# Patient Record
Sex: Female | Born: 1970 | Race: White | Hispanic: No | Marital: Married | Smoking: Never smoker
Health system: Southern US, Community
[De-identification: ages and names within clinical notes are randomized; demographics above are authoritative.]

## PROBLEM LIST (undated history)

## (undated) DIAGNOSIS — J40 Bronchitis, not specified as acute or chronic: Secondary | ICD-10-CM

## (undated) DIAGNOSIS — R519 Headache, unspecified: Secondary | ICD-10-CM

## (undated) DIAGNOSIS — J181 Lobar pneumonia, unspecified organism: Secondary | ICD-10-CM

## (undated) HISTORY — DX: Lobar pneumonia, unspecified organism: J18.1

## (undated) HISTORY — PX: TUBAL LIGATION: SHX77

---

## 2007-06-25 ENCOUNTER — Ambulatory Visit: Payer: Self-pay

## 2007-06-26 ENCOUNTER — Inpatient Hospital Stay: Payer: Self-pay

## 2013-03-04 LAB — HM PAP SMEAR: HM Pap smear: NORMAL

## 2014-03-15 LAB — BASIC METABOLIC PANEL
BUN: 8 mg/dL (ref 4–21)
CREATININE: 0.8 mg/dL (ref 0.5–1.1)
Glucose: 98 mg/dL
POTASSIUM: 4.6 mmol/L (ref 3.4–5.3)
SODIUM: 140 mmol/L (ref 137–147)

## 2014-03-15 LAB — LIPID PANEL
CHOLESTEROL: 162 mg/dL (ref 0–200)
HDL: 64 mg/dL (ref 35–70)
LDL Cholesterol: 85 mg/dL
TRIGLYCERIDES: 65 mg/dL (ref 40–160)

## 2014-03-15 LAB — HEPATIC FUNCTION PANEL
ALT: 14 U/L (ref 7–35)
AST: 12 U/L — AB (ref 13–35)

## 2014-03-15 LAB — CBC AND DIFFERENTIAL
HCT: 35 % — AB (ref 36–46)
HEMOGLOBIN: 11.7 g/dL — AB (ref 12.0–16.0)
Platelets: 322 10*3/uL (ref 150–399)
WBC: 7.5 10*3/mL

## 2014-03-15 LAB — TSH: TSH: 0.99 u[IU]/mL (ref 0.41–5.90)

## 2014-03-24 ENCOUNTER — Ambulatory Visit: Payer: Self-pay | Admitting: Family Medicine

## 2014-06-15 ENCOUNTER — Ambulatory Visit: Payer: Self-pay | Admitting: Family Medicine

## 2014-06-28 ENCOUNTER — Ambulatory Visit: Payer: Self-pay | Admitting: Family Medicine

## 2015-05-04 ENCOUNTER — Encounter: Payer: Self-pay | Admitting: *Deleted

## 2015-05-04 ENCOUNTER — Ambulatory Visit (INDEPENDENT_AMBULATORY_CARE_PROVIDER_SITE_OTHER): Payer: 59 | Admitting: Family Medicine

## 2015-05-04 ENCOUNTER — Encounter: Payer: Self-pay | Admitting: Family Medicine

## 2015-05-04 VITALS — BP 110/80 | HR 70 | Temp 99.4°F | Resp 16 | Wt 189.0 lb

## 2015-05-04 DIAGNOSIS — R319 Hematuria, unspecified: Secondary | ICD-10-CM | POA: Insufficient documentation

## 2015-05-04 DIAGNOSIS — R059 Cough, unspecified: Secondary | ICD-10-CM

## 2015-05-04 DIAGNOSIS — R05 Cough: Secondary | ICD-10-CM | POA: Diagnosis not present

## 2015-05-04 DIAGNOSIS — K589 Irritable bowel syndrome without diarrhea: Secondary | ICD-10-CM | POA: Insufficient documentation

## 2015-05-04 DIAGNOSIS — S46819A Strain of other muscles, fascia and tendons at shoulder and upper arm level, unspecified arm, initial encounter: Secondary | ICD-10-CM | POA: Insufficient documentation

## 2015-05-04 DIAGNOSIS — N920 Excessive and frequent menstruation with regular cycle: Secondary | ICD-10-CM | POA: Insufficient documentation

## 2015-05-04 DIAGNOSIS — E559 Vitamin D deficiency, unspecified: Secondary | ICD-10-CM | POA: Insufficient documentation

## 2015-05-04 DIAGNOSIS — J181 Lobar pneumonia, unspecified organism: Secondary | ICD-10-CM | POA: Insufficient documentation

## 2015-05-04 DIAGNOSIS — B373 Candidiasis of vulva and vagina: Secondary | ICD-10-CM | POA: Insufficient documentation

## 2015-05-04 DIAGNOSIS — B3731 Acute candidiasis of vulva and vagina: Secondary | ICD-10-CM | POA: Insufficient documentation

## 2015-05-04 HISTORY — DX: Lobar pneumonia, unspecified organism: J18.1

## 2015-05-04 MED ORDER — AMOXICILLIN-POT CLAVULANATE 875-125 MG PO TABS: 1.0000 | ORAL_TABLET | Freq: Two times a day (BID) | ORAL | Status: AC

## 2015-05-04 MED ORDER — HYDROCODONE-HOMATROPINE 5-1.5 MG/5ML PO SYRP: 5.0000 mL | ORAL_SOLUTION | Freq: Three times a day (TID) | ORAL | Status: DC | PRN

## 2015-05-04 MED ORDER — BENZONATATE 100 MG PO CAPS: 100.0000 mg | ORAL_CAPSULE | Freq: Three times a day (TID) | ORAL | Status: DC

## 2015-05-04 NOTE — Progress Notes (Signed)
       Patient: Denise Stephens Female    DOB: [birth year]   44 y.o.   MRN: [id number] Visit Date: 05/04/2015  Today's Provider: Mila Merryonald Michael Ventresca, MD   Chief Complaint  Patient presents with  . Sinusitis  . Cough   Subjective:    HPI Started for URI symptoms about 2 weeks ago. Symptoms were improving until cough started getting much worse and started running fevers 5 days ago. Has been taking OTC Tessalon perls which help a little bit. Cough in productive clear sputum. She states current symptoms feel much like episode of pneumonia she had last winter which took over a month and three courses of antibiotics.     Allergies  Allergen Reactions  . Sulfa Antibiotics    Previous Medications   ALBUTEROL (PROVENTIL HFA) 108 (90 BASE) MCG/ACT INHALER    Inhale 2 puffs into the lungs every 6 (six) hours as needed.   DOXYCYCLINE (VIBRA-TABS) 100 MG TABLET    Take 1 tablet by mouth 2 (two) times daily.   LEVONORGESTREL-ETHINYL ESTRADIOL (AVIANE) 0.1-20 MG-MCG TABLET    Take 1 tablet by mouth daily.   TERCONAZOLE (TERAZOL 7) 0.4 % VAGINAL CREAM    Place 1 application vaginally at bedtime.    Review of Systems  Social History  Substance Use Topics  . Smoking status: Never Smoker   . Smokeless tobacco: Not on file  . Alcohol Use: No   Objective:   BP 110/80 mmHg  Pulse 70  Temp(Src) 99.4 F (37.4 C) (Oral)  Resp 16  Wt 189 lb (85.73 kg)  Physical Exam  General Appearance:    Alert, cooperative, no distress  HENT:   ENT exam normal, no neck nodes or sinus tenderness  Eyes:    PERRL, conjunctiva/corneas clear, EOM's intact       Lungs:     Clear to auscultation bilaterally, respirations unlabored  Heart:    Regular rate and rhythm  Neurologic:   Awake, alert, oriented x 3. No apparent focal neurological           defect.           Assessment & Plan:     1. Cough Similar to previous episode of pneumonia.  Call if symptoms change or if not rapidly improving.     -  HYDROcodone-homatropine (HYCODAN) 5-1.5 MG/5ML syrup; Take 5 mLs by mouth every 8 (eight) hours as needed for cough.  Dispense: 120 mL; Refill: 0 - benzonatate (TESSALON) 100 MG capsule; Take 1 capsule (100 mg total) by mouth every 8 (eight) hours.  Dispense: 20 capsule; Refill: 0 - amoxicillin-clavulanate (AUGMENTIN) 875-125 MG tablet; Take 1 tablet by mouth 2 (two) times daily.  Dispense: 20 tablet; Refill: 0        Mila Merryonald Jacquette Canales, MD  [facility]

## 2015-05-09 ENCOUNTER — Encounter: Payer: Self-pay | Admitting: Family Medicine

## 2015-06-30 ENCOUNTER — Other Ambulatory Visit: Payer: Self-pay

## 2016-06-26 ENCOUNTER — Encounter: Payer: Self-pay | Admitting: Family Medicine

## 2016-06-26 ENCOUNTER — Ambulatory Visit (INDEPENDENT_AMBULATORY_CARE_PROVIDER_SITE_OTHER): Payer: 59 | Admitting: Family Medicine

## 2016-06-26 VITALS — BP 110/84 | HR 96 | Temp 99.4°F | Resp 18 | Wt 168.4 lb

## 2016-06-26 DIAGNOSIS — J0100 Acute maxillary sinusitis, unspecified: Secondary | ICD-10-CM

## 2016-06-26 DIAGNOSIS — R509 Fever, unspecified: Secondary | ICD-10-CM | POA: Diagnosis not present

## 2016-06-26 DIAGNOSIS — R52 Pain, unspecified: Secondary | ICD-10-CM | POA: Diagnosis not present

## 2016-06-26 LAB — POCT INFLUENZA A/B
INFLUENZA A, POC: NEGATIVE
INFLUENZA B, POC: NEGATIVE

## 2016-06-26 MED ORDER — AMOXICILLIN 875 MG PO TABS: 875.0000 mg | ORAL_TABLET | Freq: Two times a day (BID) | ORAL | 0 refills | Status: DC

## 2016-06-26 NOTE — Patient Instructions (Signed)

## 2016-06-26 NOTE — Progress Notes (Signed)
Patient: Denise Stephens Female    DOB: [birth year]   46 y.o.   MRN: [id number] Visit Date: 06/26/2016  Today's Provider: Dortha Kern, PA   Chief Complaint  Patient presents with  . URI   Subjective:    URI   This is a new problem. Episode onset: Saturday. The problem has been gradually worsening. The maximum temperature recorded prior to her arrival was 100.4 - 100.9 F. The fever has been present for 1 to 2 days. Associated symptoms include congestion, diarrhea, headaches, rhinorrhea, sinus pain and a sore throat. Associated symptoms comments: Hoarseness, fever  . She has tried antihistamine (zyrtec D) for the symptoms. The treatment provided mild relief.   Past Medical History:  Diagnosis Date  . Lobar pneumonia (HCC) 05/04/2015   Patient Active Problem List   Diagnosis Date Noted  . Blood in the urine 05/04/2015  . IBS (irritable bowel syndrome) 05/04/2015  . Excess, menstruation 05/04/2015  . Strain of trapezius muscle 05/04/2015  . Candida vaginitis 05/04/2015  . Vitamin D deficiency 05/04/2015   Past Surgical History:  Procedure Laterality Date  . CESAREAN SECTION  200, 2002, 2009   x3  . TUBAL LIGATION     Family History  Problem Relation Age of Onset  . Heart disease Father   . Thyroid disease Sister   . Colon cancer Paternal Grandfather    Allergies  Allergen Reactions  . Sulfa Antibiotics      Previous Medications   No medications on file    Review of Systems  Constitutional: Negative.   HENT: Positive for congestion, rhinorrhea, sinus pain and sore throat.   Respiratory: Negative.   Cardiovascular: Negative.   Gastrointestinal: Positive for diarrhea.  Musculoskeletal: Positive for myalgias.  Neurological: Positive for headaches.    Social History  Substance Use Topics  . Smoking status: Never Smoker  . Smokeless tobacco: Not on file  . Alcohol use No   Objective:   BP 110/84 (BP Location: Right Arm, Patient Position: Sitting, Cuff  Size: Normal)   Pulse 96   Temp 99.4 F (37.4 C) (Oral)   Resp 18   Wt 168 lb 6.4 oz (76.4 kg)   SpO2 97%   BMI 31.82 kg/m   Physical Exam  Constitutional: She is oriented to person, place, and time. She appears well-developed and well-nourished. No distress.  HENT:  Head: Normocephalic and atraumatic.  Right Ear: Hearing and external ear normal.  Left Ear: Hearing and external ear normal.  Nose: Nose normal.  Cobblestoning of posterior pharynx with tenderness and poor transillumination of maxillary sinuses (L<R).  Eyes: Conjunctivae and lids are normal. Right eye exhibits no discharge. Left eye exhibits no discharge. No scleral icterus.  Neck: Neck supple.  Cardiovascular: Normal rate and regular rhythm.   Pulmonary/Chest: Effort normal and breath sounds normal. No respiratory distress.  Musculoskeletal: Normal range of motion.  Neurological: She is alert and oriented to person, place, and time.  Skin: Skin is intact. No lesion and no rash noted.  Psychiatric: She has a normal mood and affect. Her speech is normal and behavior is normal. Thought content normal.      Assessment & Plan:     1. Fever, unspecified fever cause Onset 2-3 days ago with headache, rhinorrhea and some hoarseness. Having sinus pressure and pain now with enlargement and soreness of the left posterior cervical nodes. Negative influenza test. May use Tylenol or Advil prn. Increase fluid intake and should be out of work from school (  works in [employer]) until free of fever for 24 hours.  - POCT Influenza A/B  2. Body aches Onset with fever and headache. Negative flu test. - POCT Influenza A/B  3. Acute maxillary sinusitis, recurrence not specified Tender maxillary sinuses with poor transillumination of the left maxillary sinus today. May use Mucinex with Zyrtec-D and add Amoxicillin. Increase fluid intake and use Tylenol or Advil for discomfort or fever. Recheck prn. - amoxicillin (AMOXIL) 875 MG tablet;  Take 1 tablet (875 mg total) by mouth 2 (two) times daily.  Dispense: 20 tablet; Refill: 0

## 2016-06-29 ENCOUNTER — Other Ambulatory Visit: Payer: Self-pay | Admitting: Family Medicine

## 2016-06-29 ENCOUNTER — Telehealth: Payer: Self-pay | Admitting: Family Medicine

## 2016-06-29 MED ORDER — FLUCONAZOLE 150 MG PO TABS: 150.0000 mg | ORAL_TABLET | Freq: Once | ORAL | 0 refills | Status: AC

## 2016-06-29 NOTE — Telephone Encounter (Signed)
[facility] [place] [facility] [place]

## 2016-06-29 NOTE — Telephone Encounter (Signed)
Pt has been on antibiotic since Tuesday for sinus infection. She is starting to get a yeast infection.  She is eating yogurt.  [place] [facility] [place] and would like diflucan pill scall in  Pt's call back is [phone]  Thanks, Barth Kirkseri

## 2016-06-29 NOTE — Telephone Encounter (Signed)
Patient advised.

## 2016-07-16 ENCOUNTER — Encounter: Payer: Self-pay | Admitting: Physician Assistant

## 2016-07-16 ENCOUNTER — Ambulatory Visit (INDEPENDENT_AMBULATORY_CARE_PROVIDER_SITE_OTHER): Payer: 59 | Admitting: Physician Assistant

## 2016-07-16 VITALS — BP 118/80 | HR 80 | Temp 98.9°F | Resp 16 | Wt 172.0 lb

## 2016-07-16 DIAGNOSIS — Z1231 Encounter for screening mammogram for malignant neoplasm of breast: Secondary | ICD-10-CM

## 2016-07-16 DIAGNOSIS — Z0000 Encounter for general adult medical examination without abnormal findings: Secondary | ICD-10-CM

## 2016-07-16 DIAGNOSIS — Z111 Encounter for screening for respiratory tuberculosis: Secondary | ICD-10-CM | POA: Diagnosis not present

## 2016-07-16 DIAGNOSIS — D649 Anemia, unspecified: Secondary | ICD-10-CM

## 2016-07-16 DIAGNOSIS — Z1239 Encounter for other screening for malignant neoplasm of breast: Secondary | ICD-10-CM

## 2016-07-16 NOTE — Progress Notes (Signed)
     Patient: Denise Stephens, Female    DOB: 09/12/1970, 45 y.o.   MRN: [id number] Visit Date: 07/16/2016  Today's Provider: Adriana M Pollak, PA-C   Chief Complaint  Patient presents with  . Annual Exam   Subjective:    Annual physical exam Denise Stephens is a 45 y.o. female who presents today for health maintenance and complete physical. She feels fairly well. Still having some sinus symptoms, was seen in clinic a week ago by Dennis.   She reports not exercising. She reports she is sleeping fairly well.  She is starting a new job at [employer] in [place], she needs a TB screening for this.  She does not smoke or drink.  She is married for 20 years and lives in  with husband and kids. Three sons, ages 17, 14, 9.   Last PAP/HPV in 2014, normal. Never had mammogram. Paternal grandmother with breast cancer, uncertain age. No hx colon cancer in family.   Otherwise healthy. -----------------------------------------------------------------   Review of Systems  Constitutional: Negative.   HENT: Positive for sore throat.   Eyes: Negative.   Respiratory: Positive for cough.   Gastrointestinal: Negative.   Endocrine: Negative.   Genitourinary: Negative.   Musculoskeletal: Negative.   Skin: Negative.   Allergic/Immunologic: Negative.   Neurological: Negative.   Hematological: Negative.   Psychiatric/Behavioral: Negative.     Social History      She  reports that she has never smoked. She has never used smokeless tobacco. She reports that she does not drink alcohol or use drugs.       Social History   Social History  . Marital status: Married    Spouse name: N/A  . Number of children: 3  . Years of education: N/A   Social History Main Topics  . Smoking status: Never Smoker  . Smokeless tobacco: Never Used  . Alcohol use No  . Drug use: No  . Sexual activity: Yes   Other Topics Concern  . None   Social History Narrative  .  None    Past Medical History:  Diagnosis Date  . Lobar pneumonia (HCC) 05/04/2015     Patient Active Problem List   Diagnosis Date Noted  . Blood in the urine 05/04/2015  . IBS (irritable bowel syndrome) 05/04/2015  . Excess, menstruation 05/04/2015  . Strain of trapezius muscle 05/04/2015  . Candida vaginitis 05/04/2015  . Vitamin D deficiency 05/04/2015    Past Surgical History:  Procedure Laterality Date  . CESAREAN SECTION  200, 2002, 2009   x3  . TUBAL LIGATION      Family History        Family Status  Relation Status  . Mother Alive  . Father Alive  . Sister Alive  . Paternal Grandfather Deceased   colon cancer        Her family history includes Colon cancer in her paternal grandfather; Heart disease in her father; Thyroid disease in her sister.     Allergies  Allergen Reactions  . Sulfa Antibiotics     No current outpatient prescriptions on file.   Patient Care Team: Donald E Fisher, MD as PCP - General (Family Medicine)      Objective:   Vitals: BP 118/80 (BP Location: Left Arm, Patient Position: Sitting, Cuff Size: Normal)   Pulse 80   Temp 98.9 F (37.2 C) (Oral)   Resp 16   Wt 172 lb (78 kg)   LMP 07/05/2016     BMI 32.50 kg/m    Physical Exam  Constitutional: She is oriented to person, place, and time. She appears well-developed and well-nourished.  HENT:  Right Ear: External ear normal.  Left Ear: External ear normal.  Mouth/Throat: Oropharynx is clear and moist. No oropharyngeal exudate.  Eyes: Conjunctivae are normal.  Neck: Neck supple. No thyromegaly present.  Cardiovascular: Normal rate, regular rhythm and normal heart sounds.  Exam reveals no gallop and no friction rub.   No murmur heard. Pulmonary/Chest: Effort normal and breath sounds normal. Right breast exhibits no inverted nipple, no mass, no nipple discharge, no skin change and no tenderness. Left breast exhibits no inverted nipple, no mass, no nipple discharge, no skin  change and no tenderness. Breasts are symmetrical.  Chaperoned  Abdominal: Soft. Bowel sounds are normal.  Lymphadenopathy:    She has no cervical adenopathy.  Neurological: She is alert and oriented to person, place, and time.  Skin: Skin is warm and dry.  Psychiatric: She has a normal mood and affect. Her behavior is normal.     Depression Screen PHQ 2/9 Scores 07/16/2016  PHQ - 2 Score 0      Assessment & Plan:     Routine Health Maintenance and Physical Exam  Exercise Activities and Dietary recommendations Goals    None      Immunization History  Administered Date(s) Administered  . Influenza,inj,Quad PF,36+ Mos 03/04/2013  . Tdap 03/04/2013    Health Maintenance  Topic Date Due  . HIV Screening  06/17/1988  . PAP SMEAR  06/17/1994  . INFLUENZA VACCINE  01/17/2016  . TETANUS/TDAP  03/05/2023     Discussed health benefits of physical activity, and encouraged her to engage in regular exercise appropriate for her age and condition.    1. Annual physical exam  Return next year for PAP/HPV exam.  - Comprehensive metabolic panel - TSH - Lipid panel  2. Screening-pulmonary TB  - Quantiferon tb gold assay  3. Low hemoglobin  - CBC with Differential/Platelet  4. Breast cancer screening  Gave info on [facility]. - MM DIGITAL SCREENING BILATERAL; Future  Return in about 1 year (around 07/16/2017) for CPE with pap.   Patient Instructions  Health Maintenance, Female Introduction Adopting a healthy lifestyle and getting preventive care can go a long way to promote health and wellness. Talk with your health care provider about what schedule of regular examinations is right for you. This is a good chance for you to check in with your provider about disease prevention and staying healthy. In between checkups, there are plenty of things you can do on your own. Experts have done a lot of research about which lifestyle changes and preventive measures  are most likely to keep you healthy. Ask your health care provider for more information. Weight and diet Eat a healthy diet  Be sure to include plenty of vegetables, fruits, low-fat dairy products, and lean protein.  Do not eat a lot of foods high in solid fats, added sugars, or salt.  Get regular exercise. This is one of the most important things you can do for your health.  Most adults should exercise for at least 150 minutes each week. The exercise should increase your heart rate and make you sweat (moderate-intensity exercise).  Most adults should also do strengthening exercises at least twice a week. This is in addition to the moderate-intensity exercise. Maintain a healthy weight  Body mass index (BMI) is a measurement that can be used to identify   possible weight problems. It estimates body fat based on height and weight. Your health care provider can help determine your BMI and help you achieve or maintain a healthy weight.  For females 65 years of age and older:  A BMI below 18.5 is considered underweight.  A BMI of 18.5 to 24.9 is normal.  A BMI of 25 to 29.9 is considered overweight.  A BMI of 30 and above is considered obese. Watch levels of cholesterol and blood lipids  You should start having your blood tested for lipids and cholesterol at 44 years of age, then have this test every 5 years.  You may need to have your cholesterol levels checked more often if:  Your lipid or cholesterol levels are high.  You are older than 44 years of age.  You are at high risk for heart disease. Cancer screening Lung Cancer  Lung cancer screening is recommended for adults 26-71 years old who are at high risk for lung cancer because of a history of smoking.  A yearly low-dose CT scan of the lungs is recommended for people who:  Currently smoke.  Have quit within the past 15 years.  Have at least a 30-pack-year history of smoking. A pack year is smoking an average of one pack  of cigarettes a day for 1 year.  Yearly screening should continue until it has been 15 years since you quit.  Yearly screening should stop if you develop a health problem that would prevent you from having lung cancer treatment. Breast Cancer  Practice breast self-awareness. This means understanding how your breasts normally appear and feel.  It also means doing regular breast self-exams. Let your health care provider know about any changes, no matter how small.  If you are in your 20s or 30s, you should have a clinical breast exam (CBE) by a health care provider every 1-3 years as part of a regular health exam.  If you are 72 or older, have a CBE every year. Also consider having a breast X-ray (mammogram) every year.  If you have a family history of breast cancer, talk to your health care provider about genetic screening.  If you are at high risk for breast cancer, talk to your health care provider about having an MRI and a mammogram every year.  Breast cancer gene (BRCA) assessment is recommended for women who have family members with BRCA-related cancers. BRCA-related cancers include:  Breast.  Ovarian.  Tubal.  Peritoneal cancers.  Results of the assessment will determine the need for genetic counseling and BRCA1 and BRCA2 testing. Cervical Cancer  Your health care provider may recommend that you be screened regularly for cancer of the pelvic organs (ovaries, uterus, and vagina). This screening involves a pelvic examination, including checking for microscopic changes to the surface of your cervix (Pap test). You may be encouraged to have this screening done every 3 years, beginning at age 49.  For women ages 59-65, health care providers may recommend pelvic exams and Pap testing every 3 years, or they may recommend the Pap and pelvic exam, combined with testing for human papilloma virus (HPV), every 5 years. Some types of HPV increase your risk of cervical cancer. Testing for HPV  may also be done on women of any age with unclear Pap test results.  Other health care providers may not recommend any screening for nonpregnant women who are considered low risk for pelvic cancer and who do not have symptoms. Ask your health care provider if a  screening pelvic exam is right for you.  If you have had past treatment for cervical cancer or a condition that could lead to cancer, you need Pap tests and screening for cancer for at least 20 years after your treatment. If Pap tests have been discontinued, your risk factors (such as having a new sexual partner) need to be reassessed to determine if screening should resume. Some women have medical problems that increase the chance of getting cervical cancer. In these cases, your health care provider may recommend more frequent screening and Pap tests. Colorectal Cancer  This type of cancer can be detected and often prevented.  Routine colorectal cancer screening usually begins at 44 years of age and continues through 44 years of age.  Your health care provider may recommend screening at an earlier age if you have risk factors for colon cancer.  Your health care provider may also recommend using home test kits to check for hidden blood in the stool.  A small camera at the end of a tube can be used to examine your colon directly (sigmoidoscopy or colonoscopy). This is done to check for the earliest forms of colorectal cancer.  Routine screening usually begins at age 46.  Direct examination of the colon should be repeated every 5-10 years through 44 years of age. However, you may need to be screened more often if early forms of precancerous polyps or small growths are found. Skin Cancer  Check your skin from head to toe regularly.  Tell your health care provider about any new moles or changes in moles, especially if there is a change in a mole's shape or color.  Also tell your health care provider if you have a mole that is larger than  the size of a pencil eraser.  Always use sunscreen. Apply sunscreen liberally and repeatedly throughout the day.  Protect yourself by wearing long sleeves, pants, a wide-brimmed hat, and sunglasses whenever you are outside. Heart disease, diabetes, and high blood pressure  High blood pressure causes heart disease and increases the risk of stroke. High blood pressure is more likely to develop in:  People who have blood pressure in the high end of the normal range (130-139/85-89 mm Hg).  People who are overweight or obese.  People who are African American.  If you are 52-63 years of age, have your blood pressure checked every 3-5 years. If you are 86 years of age or older, have your blood pressure checked every year. You should have your blood pressure measured twice-once when you are at a hospital or clinic, and once when you are not at a hospital or clinic. Record the average of the two measurements. To check your blood pressure when you are not at a hospital or clinic, you can use:  An automated blood pressure machine at a pharmacy.  A home blood pressure monitor.  If you are between 26 years and 85 years old, ask your health care provider if you should take aspirin to prevent strokes.  Have regular diabetes screenings. This involves taking a blood sample to check your fasting blood sugar level.  If you are at a normal weight and have a low risk for diabetes, have this test once every three years after 44 years of age.  If you are overweight and have a high risk for diabetes, consider being tested at a younger age or more often. Preventing infection Hepatitis B  If you have a higher risk for hepatitis B, you should be screened  for this virus. You are considered at high risk for hepatitis B if:  You were born in a country where hepatitis B is common. Ask your health care provider which countries are considered high risk.  Your parents were born in a high-risk country, and you have  not been immunized against hepatitis B (hepatitis B vaccine).  You have HIV or AIDS.  You use needles to inject street drugs.  You live with someone who has hepatitis B.  You have had sex with someone who has hepatitis B.  You get hemodialysis treatment.  You take certain medicines for conditions, including cancer, organ transplantation, and autoimmune conditions. Hepatitis C  Blood testing is recommended for:  Everyone born from [birth year] through 1963.  Anyone with known risk factors for hepatitis C. Sexually transmitted infections (STIs)  You should be screened for sexually transmitted infections (STIs) including gonorrhea and chlamydia if:  You are sexually active and are younger than 44 years of age.  You are older than 44 years of age and your health care provider tells you that you are at risk for this type of infection.  Your sexual activity has changed since you were last screened and you are at an increased risk for chlamydia or gonorrhea. Ask your health care provider if you are at risk.  If you do not have HIV, but are at risk, it may be recommended that you take a prescription medicine daily to prevent HIV infection. This is called pre-exposure prophylaxis (PrEP). You are considered at risk if:  You are sexually active and do not regularly use condoms or know the HIV status of your partner(s).  You take drugs by injection.  You are sexually active with a partner who has HIV. Talk with your health care provider about whether you are at high risk of being infected with HIV. If you choose to begin PrEP, you should first be tested for HIV. You should then be tested every 3 months for as long as you are taking PrEP. Pregnancy  If you are premenopausal and you may become pregnant, ask your health care provider about preconception counseling.  If you may become pregnant, take 400 to 800 micrograms (mcg) of folic acid every day.  If you want to prevent pregnancy, talk to  your health care provider about birth control (contraception). Osteoporosis and menopause  Osteoporosis is a disease in which the bones lose minerals and strength with aging. This can result in serious bone fractures. Your risk for osteoporosis can be identified using a bone density scan.  If you are 58 years of age or older, or if you are at risk for osteoporosis and fractures, ask your health care provider if you should be screened.  Ask your health care provider whether you should take a calcium or vitamin D supplement to lower your risk for osteoporosis.  Menopause may have certain physical symptoms and risks.  Hormone replacement therapy may reduce some of these symptoms and risks. Talk to your health care provider about whether hormone replacement therapy is right for you. Follow these instructions at home:  Schedule regular health, dental, and eye exams.  Stay current with your immunizations.  Do not use any tobacco products including cigarettes, chewing tobacco, or electronic cigarettes.  If you are pregnant, do not drink alcohol.  If you are breastfeeding, limit how much and how often you drink alcohol.  Limit alcohol intake to no more than 1 drink per day for nonpregnant women. One drink equals 12  ounces of beer, 5 ounces of wine, or 1 ounces of hard liquor.  Do not use street drugs.  Do not share needles.  Ask your health care provider for help if you need support or information about quitting drugs.  Tell your health care provider if you often feel depressed.  Tell your health care provider if you have ever been abused or do not feel safe at home. This information is not intended to replace advice given to you by your health care provider. Make sure you discuss any questions you have with your health care provider. Document Released: 12/18/2010 Document Revised: 11/10/2015 Document Reviewed: 03/08/2015  2017 [employer] Health Maintenance,  Female Introduction Adopting a healthy lifestyle and getting preventive care can go a long way to promote health and wellness. Talk with your health care provider about what schedule of regular examinations is right for you. This is a good chance for you to check in with your provider about disease prevention and staying healthy. In between checkups, there are plenty of things you can do on your own. Experts have done a lot of research about which lifestyle changes and preventive measures are most likely to keep you healthy. Ask your health care provider for more information. Weight and diet Eat a healthy diet  Be sure to include plenty of vegetables, fruits, low-fat dairy products, and lean protein.  Do not eat a lot of foods high in solid fats, added sugars, or salt.  Get regular exercise. This is one of the most important things you can do for your health.  Most adults should exercise for at least 150 minutes each week. The exercise should increase your heart rate and make you sweat (moderate-intensity exercise).  Most adults should also do strengthening exercises at least twice a week. This is in addition to the moderate-intensity exercise. Maintain a healthy weight  Body mass index (BMI) is a measurement that can be used to identify possible weight problems. It estimates body fat based on height and weight. Your health care provider can help determine your BMI and help you achieve or maintain a healthy weight.  For females 21 years of age and older:  A BMI below 18.5 is considered underweight.  A BMI of 18.5 to 24.9 is normal.  A BMI of 25 to 29.9 is considered overweight.  A BMI of 30 and above is considered obese. Watch levels of cholesterol and blood lipids  You should start having your blood tested for lipids and cholesterol at 44 years of age, then have this test every 5 years.  You may need to have your cholesterol levels checked more often if:  Your lipid or cholesterol  levels are high.  You are older than 44 years of age.  You are at high risk for heart disease. Cancer screening Lung Cancer  Lung cancer screening is recommended for adults 11-15 years old who are at high risk for lung cancer because of a history of smoking.  A yearly low-dose CT scan of the lungs is recommended for people who:  Currently smoke.  Have quit within the past 15 years.  Have at least a 30-pack-year history of smoking. A pack year is smoking an average of one pack of cigarettes a day for 1 year.  Yearly screening should continue until it has been 15 years since you quit.  Yearly screening should stop if you develop a health problem that would prevent you from having lung cancer treatment. Breast Cancer  Practice breast self-awareness.  This means understanding how your breasts normally appear and feel.  It also means doing regular breast self-exams. Let your health care provider know about any changes, no matter how small.  If you are in your 20s or 30s, you should have a clinical breast exam (CBE) by a health care provider every 1-3 years as part of a regular health exam.  If you are 38 or older, have a CBE every year. Also consider having a breast X-ray (mammogram) every year.  If you have a family history of breast cancer, talk to your health care provider about genetic screening.  If you are at high risk for breast cancer, talk to your health care provider about having an MRI and a mammogram every year.  Breast cancer gene (BRCA) assessment is recommended for women who have family members with BRCA-related cancers. BRCA-related cancers include:  Breast.  Ovarian.  Tubal.  Peritoneal cancers.  Results of the assessment will determine the need for genetic counseling and BRCA1 and BRCA2 testing. Cervical Cancer  Your health care provider may recommend that you be screened regularly for cancer of the pelvic organs (ovaries, uterus, and vagina). This screening  involves a pelvic examination, including checking for microscopic changes to the surface of your cervix (Pap test). You may be encouraged to have this screening done every 3 years, beginning at age 82.  For women ages 38-65, health care providers may recommend pelvic exams and Pap testing every 3 years, or they may recommend the Pap and pelvic exam, combined with testing for human papilloma virus (HPV), every 5 years. Some types of HPV increase your risk of cervical cancer. Testing for HPV may also be done on women of any age with unclear Pap test results.  Other health care providers may not recommend any screening for nonpregnant women who are considered low risk for pelvic cancer and who do not have symptoms. Ask your health care provider if a screening pelvic exam is right for you.  If you have had past treatment for cervical cancer or a condition that could lead to cancer, you need Pap tests and screening for cancer for at least 20 years after your treatment. If Pap tests have been discontinued, your risk factors (such as having a new sexual partner) need to be reassessed to determine if screening should resume. Some women have medical problems that increase the chance of getting cervical cancer. In these cases, your health care provider may recommend more frequent screening and Pap tests. Colorectal Cancer  This type of cancer can be detected and often prevented.  Routine colorectal cancer screening usually begins at 44 years of age and continues through 44 years of age.  Your health care provider may recommend screening at an earlier age if you have risk factors for colon cancer.  Your health care provider may also recommend using home test kits to check for hidden blood in the stool.  A small camera at the end of a tube can be used to examine your colon directly (sigmoidoscopy or colonoscopy). This is done to check for the earliest forms of colorectal cancer.  Routine screening usually  begins at age 37.  Direct examination of the colon should be repeated every 5-10 years through 44 years of age. However, you may need to be screened more often if early forms of precancerous polyps or small growths are found. Skin Cancer  Check your skin from head to toe regularly.  Tell your health care provider about any new moles  or changes in moles, especially if there is a change in a mole's shape or color.  Also tell your health care provider if you have a mole that is larger than the size of a pencil eraser.  Always use sunscreen. Apply sunscreen liberally and repeatedly throughout the day.  Protect yourself by wearing long sleeves, pants, a wide-brimmed hat, and sunglasses whenever you are outside. Heart disease, diabetes, and high blood pressure  High blood pressure causes heart disease and increases the risk of stroke. High blood pressure is more likely to develop in:  People who have blood pressure in the high end of the normal range (130-139/85-89 mm Hg).  People who are overweight or obese.  People who are African American.  If you are 18-39 years of age, have your blood pressure checked every 3-5 years. If you are 40 years of age or older, have your blood pressure checked every year. You should have your blood pressure measured twice-once when you are at a hospital or clinic, and once when you are not at a hospital or clinic. Record the average of the two measurements. To check your blood pressure when you are not at a hospital or clinic, you can use:  An automated blood pressure machine at a pharmacy.  A home blood pressure monitor.  If you are between 55 years and 79 years old, ask your health care provider if you should take aspirin to prevent strokes.  Have regular diabetes screenings. This involves taking a blood sample to check your fasting blood sugar level.  If you are at a normal weight and have a low risk for diabetes, have this test once every three years  after 45 years of age.  If you are overweight and have a high risk for diabetes, consider being tested at a younger age or more often. Preventing infection Hepatitis B  If you have a higher risk for hepatitis B, you should be screened for this virus. You are considered at high risk for hepatitis B if:  You were born in a country where hepatitis B is common. Ask your health care provider which countries are considered high risk.  Your parents were born in a high-risk country, and you have not been immunized against hepatitis B (hepatitis B vaccine).  You have HIV or AIDS.  You use needles to inject street drugs.  You live with someone who has hepatitis B.  You have had sex with someone who has hepatitis B.  You get hemodialysis treatment.  You take certain medicines for conditions, including cancer, organ transplantation, and autoimmune conditions. Hepatitis C  Blood testing is recommended for:  Everyone born from 1945 through 1965.  Anyone with known risk factors for hepatitis C. Sexually transmitted infections (STIs)  You should be screened for sexually transmitted infections (STIs) including gonorrhea and chlamydia if:  You are sexually active and are younger than 44 years of age.  You are older than 44 years of age and your health care provider tells you that you are at risk for this type of infection.  Your sexual activity has changed since you were last screened and you are at an increased risk for chlamydia or gonorrhea. Ask your health care provider if you are at risk.  If you do not have HIV, but are at risk, it may be recommended that you take a prescription medicine daily to prevent HIV infection. This is called pre-exposure prophylaxis (PrEP). You are considered at risk if:  You are sexually   active and do not regularly use condoms or know the HIV status of your partner(s).  You take drugs by injection.  You are sexually active with a partner who has HIV. Talk  with your health care provider about whether you are at high risk of being infected with HIV. If you choose to begin PrEP, you should first be tested for HIV. You should then be tested every 3 months for as long as you are taking PrEP. Pregnancy  If you are premenopausal and you may become pregnant, ask your health care provider about preconception counseling.  If you may become pregnant, take 400 to 800 micrograms (mcg) of folic acid every day.  If you want to prevent pregnancy, talk to your health care provider about birth control (contraception). Osteoporosis and menopause  Osteoporosis is a disease in which the bones lose minerals and strength with aging. This can result in serious bone fractures. Your risk for osteoporosis can be identified using a bone density scan.  If you are 5 years of age or older, or if you are at risk for osteoporosis and fractures, ask your health care provider if you should be screened.  Ask your health care provider whether you should take a calcium or vitamin D supplement to lower your risk for osteoporosis.  Menopause may have certain physical symptoms and risks.  Hormone replacement therapy may reduce some of these symptoms and risks. Talk to your health care provider about whether hormone replacement therapy is right for you. Follow these instructions at home:  Schedule regular health, dental, and eye exams.  Stay current with your immunizations.  Do not use any tobacco products including cigarettes, chewing tobacco, or electronic cigarettes.  If you are pregnant, do not drink alcohol.  If you are breastfeeding, limit how much and how often you drink alcohol.  Limit alcohol intake to no more than 1 drink per day for nonpregnant women. One drink equals 12 ounces of beer, 5 ounces of wine, or 1 ounces of hard liquor.  Do not use street drugs.  Do not share needles.  Ask your health care provider for help if you need support or information  about quitting drugs.  Tell your health care provider if you often feel depressed.  Tell your health care provider if you have ever been abused or do not feel safe at home. This information is not intended to replace advice given to you by your health care provider. Make sure you discuss any questions you have with your health care provider. Document Released: 12/18/2010 Document Revised: 11/10/2015 Document Reviewed: 03/08/2015  2017 [employer]   The entirety of the information documented in the History of Present Illness, Review of Systems and Physical Exam were personally obtained by me. Portions of this information were initially documented by Ashley Royalty, CMA and reviewed by me for thoroughness and accuracy.      --------------------------------------------------------------------    Trinna Post, PA-C  [facility]

## 2016-07-16 NOTE — Patient Instructions (Addendum)
Health Maintenance, Female Introduction Adopting a healthy lifestyle and getting preventive care can go a long way to promote health and wellness. Talk with your health care provider about what schedule of regular examinations is right for you. This is a good chance for you to check in with your provider about disease prevention and staying healthy. In between checkups, there are plenty of things you can do on your own. Experts have done a lot of research about which lifestyle changes and preventive measures are most likely to keep you healthy. Ask your health care provider for more information. Weight and diet Eat a healthy diet  Be sure to include plenty of vegetables, fruits, low-fat dairy products, and lean protein.  Do not eat a lot of foods high in solid fats, added sugars, or salt.  Get regular exercise. This is one of the most important things you can do for your health.  Most adults should exercise for at least 150 minutes each week. The exercise should increase your heart rate and make you sweat (moderate-intensity exercise).  Most adults should also do strengthening exercises at least twice a week. This is in addition to the moderate-intensity exercise. Maintain a healthy weight  Body mass index (BMI) is a measurement that can be used to identify possible weight problems. It estimates body fat based on height and weight.  Your health care provider can help determine your BMI and help you achieve or maintain a healthy weight.  For females 44 years of age and older:  A BMI below 18.5 is considered underweight.  A BMI of 18.5 to 24.9 is normal.  A BMI of 25 to 29.9 is considered overweight.  A BMI of 30 and above is considered obese. Watch levels of cholesterol and blood lipids  You should start having your blood tested for lipids and cholesterol at 44 years of age, then have this test every 5 years.  You may need to have your cholesterol levels checked more often if:  Your  lipid or cholesterol levels are high.  You are older than 44 years of age.  You are at high risk for heart disease. Cancer screening Lung Cancer  Lung cancer screening is recommended for adults 44-22 years old who are at high risk for lung cancer because of a history of smoking.  A yearly low-dose CT scan of the lungs is recommended for people who:  Currently smoke.  Have quit within the past 44 years.  Have at least a 30-pack-year history of smoking. A pack year is smoking an average of one pack of cigarettes a day for 1 year.  Yearly screening should continue until it has been 15 years since you quit.  Yearly screening should stop if you develop a health problem that would prevent you from having lung cancer treatment. Breast Cancer  Practice breast self-awareness. This means understanding how your breasts normally appear and feel.  It also means doing regular breast self-exams. Let your health care provider know about any changes, no matter how small.  If you are in your 20s or 30s, you should have a clinical breast exam (CBE) by a health care provider every 1-3 years as part of a regular health exam.  If you are 35 or older, have a CBE every year. Also consider having a breast X-ray (mammogram) every year.  If you have a family history of breast cancer, talk to your health care provider about genetic screening.  If you are at high risk for breast cancer,  talk to your health care provider about having an MRI and a mammogram every year.  Breast cancer gene (BRCA) assessment is recommended for women who have family members with BRCA-related cancers. BRCA-related cancers include:  Breast.  Ovarian.  Tubal.  Peritoneal cancers.  Results of the assessment will determine the need for genetic counseling and BRCA1 and BRCA2 testing. Cervical Cancer  Your health care provider may recommend that you be screened regularly for cancer of the pelvic organs (ovaries, uterus, and  vagina). This screening involves a pelvic examination, including checking for microscopic changes to the surface of your cervix (Pap test). You may be encouraged to have this screening done every 3 years, beginning at age 21.  For women ages 30-65, health care providers may recommend pelvic exams and Pap testing every 3 years, or they may recommend the Pap and pelvic exam, combined with testing for human papilloma virus (HPV), every 5 years. Some types of HPV increase your risk of cervical cancer. Testing for HPV may also be done on women of any age with unclear Pap test results.  Other health care providers may not recommend any screening for nonpregnant women who are considered low risk for pelvic cancer and who do not have symptoms. Ask your health care provider if a screening pelvic exam is right for you.  If you have had past treatment for cervical cancer or a condition that could lead to cancer, you need Pap tests and screening for cancer for at least 20 years after your treatment. If Pap tests have been discontinued, your risk factors (such as having a new sexual partner) need to be reassessed to determine if screening should resume. Some women have medical problems that increase the chance of getting cervical cancer. In these cases, your health care provider may recommend more frequent screening and Pap tests. Colorectal Cancer  This type of cancer can be detected and often prevented.  Routine colorectal cancer screening usually begins at 44 years of age and continues through 44 years of age.  Your health care provider may recommend screening at an earlier age if you have risk factors for colon cancer.  Your health care provider may also recommend using home test kits to check for hidden blood in the stool.  A small camera at the end of a tube can be used to examine your colon directly (sigmoidoscopy or colonoscopy). This is done to check for the earliest forms of colorectal  cancer.  Routine screening usually begins at age 50.  Direct examination of the colon should be repeated every 5-10 years through 44 years of age. However, you may need to be screened more often if early forms of precancerous polyps or small growths are found. Skin Cancer  Check your skin from head to toe regularly.  Tell your health care provider about any new moles or changes in moles, especially if there is a change in a mole's shape or color.  Also tell your health care provider if you have a mole that is larger than the size of a pencil eraser.  Always use sunscreen. Apply sunscreen liberally and repeatedly throughout the day.  Protect yourself by wearing long sleeves, pants, a wide-brimmed hat, and sunglasses whenever you are outside. Heart disease, diabetes, and high blood pressure  High blood pressure causes heart disease and increases the risk of stroke. High blood pressure is more likely to develop in:  People who have blood pressure in the high end of the normal range (130-139/85-89 mm Hg).    People who are overweight or obese.  People who are African American.  If you are 55-88 years of age, have your blood pressure checked every 3-5 years. If you are 10 years of age or older, have your blood pressure checked every year. You should have your blood pressure measured twice--once when you are at a hospital or clinic, and once when you are not at a hospital or clinic. Record the average of the two measurements. To check your blood pressure when you are not at a hospital or clinic, you can use:  An automated blood pressure machine at a pharmacy.  A home blood pressure monitor.  If you are between 29 years and 58 years old, ask your health care provider if you should take aspirin to prevent strokes.  Have regular diabetes screenings. This involves taking a blood sample to check your fasting blood sugar level.  If you are at a normal weight and have a low risk for diabetes,  have this test once every three years after 44 years of age.  If you are overweight and have a high risk for diabetes, consider being tested at a younger age or more often. Preventing infection Hepatitis B  If you have a higher risk for hepatitis B, you should be screened for this virus. You are considered at high risk for hepatitis B if:  You were born in a country where hepatitis B is common. Ask your health care provider which countries are considered high risk.  Your parents were born in a high-risk country, and you have not been immunized against hepatitis B (hepatitis B vaccine).  You have HIV or AIDS.  You use needles to inject street drugs.  You live with someone who has hepatitis B.  You have had sex with someone who has hepatitis B.  You get hemodialysis treatment.  You take certain medicines for conditions, including cancer, organ transplantation, and autoimmune conditions. Hepatitis C  Blood testing is recommended for:  Everyone born from [birth year] through 1963.  Anyone with known risk factors for hepatitis C. Sexually transmitted infections (STIs)  You should be screened for sexually transmitted infections (STIs) including gonorrhea and chlamydia if:  You are sexually active and are younger than 44 years of age.  You are older than 44 years of age and your health care provider tells you that you are at risk for this type of infection.  Your sexual activity has changed since you were last screened and you are at an increased risk for chlamydia or gonorrhea. Ask your health care provider if you are at risk.  If you do not have HIV, but are at risk, it may be recommended that you take a prescription medicine daily to prevent HIV infection. This is called pre-exposure prophylaxis (PrEP). You are considered at risk if:  You are sexually active and do not regularly use condoms or know the HIV status of your partner(s).  You take drugs by injection.  You are sexually  active with a partner who has HIV. Talk with your health care provider about whether you are at high risk of being infected with HIV. If you choose to begin PrEP, you should first be tested for HIV. You should then be tested every 3 months for as long as you are taking PrEP. Pregnancy  If you are premenopausal and you may become pregnant, ask your health care provider about preconception counseling.  If you may become pregnant, take 400 to 800 micrograms (mcg) of folic acid  every day.  If you want to prevent pregnancy, talk to your health care provider about birth control (contraception). Osteoporosis and menopause  Osteoporosis is a disease in which the bones lose minerals and strength with aging. This can result in serious bone fractures. Your risk for osteoporosis can be identified using a bone density scan.  If you are 65 years of age or older, or if you are at risk for osteoporosis and fractures, ask your health care provider if you should be screened.  Ask your health care provider whether you should take a calcium or vitamin D supplement to lower your risk for osteoporosis.  Menopause may have certain physical symptoms and risks.  Hormone replacement therapy may reduce some of these symptoms and risks. Talk to your health care provider about whether hormone replacement therapy is right for you. Follow these instructions at home:  Schedule regular health, dental, and eye exams.  Stay current with your immunizations.  Do not use any tobacco products including cigarettes, chewing tobacco, or electronic cigarettes.  If you are pregnant, do not drink alcohol.  If you are breastfeeding, limit how much and how often you drink alcohol.  Limit alcohol intake to no more than 1 drink per day for nonpregnant women. One drink equals 12 ounces of beer, 5 ounces of wine, or 1 ounces of hard liquor.  Do not use street drugs.  Do not share needles.  Ask your health care provider for  help if you need support or information about quitting drugs.  Tell your health care provider if you often feel depressed.  Tell your health care provider if you have ever been abused or do not feel safe at home. This information is not intended to replace advice given to you by your health care provider. Make sure you discuss any questions you have with your health care provider. Document Released: 12/18/2010 Document Revised: 11/10/2015 Document Reviewed: 03/08/2015  2017 [employer] Health Maintenance, Female Introduction Adopting a healthy lifestyle and getting preventive care can go a long way to promote health and wellness. Talk with your health care provider about what schedule of regular examinations is right for you. This is a good chance for you to check in with your provider about disease prevention and staying healthy. In between checkups, there are plenty of things you can do on your own. Experts have done a lot of research about which lifestyle changes and preventive measures are most likely to keep you healthy. Ask your health care provider for more information. Weight and diet Eat a healthy diet  Be sure to include plenty of vegetables, fruits, low-fat dairy products, and lean protein.  Do not eat a lot of foods high in solid fats, added sugars, or salt.  Get regular exercise. This is one of the most important things you can do for your health.  Most adults should exercise for at least 150 minutes each week. The exercise should increase your heart rate and make you sweat (moderate-intensity exercise).  Most adults should also do strengthening exercises at least twice a week. This is in addition to the moderate-intensity exercise. Maintain a healthy weight  Body mass index (BMI) is a measurement that can be used to identify possible weight problems. It estimates body fat based on height and weight.  Your health care provider can help determine your BMI and help you achieve or  maintain a healthy weight.  For females 20 years of age and older:  A BMI below 18.5 is considered underweight.    A BMI of 18.5 to 24.9 is normal.  A BMI of 25 to 29.9 is considered overweight.  A BMI of 30 and above is considered obese. Watch levels of cholesterol and blood lipids  You should start having your blood tested for lipids and cholesterol at 44 years of age, then have this test every 5 years.  You may need to have your cholesterol levels checked more often if:  Your lipid or cholesterol levels are high.  You are older than 44 years of age.  You are at high risk for heart disease. Cancer screening Lung Cancer  Lung cancer screening is recommended for adults 55-80 years old who are at high risk for lung cancer because of a history of smoking.  A yearly low-dose CT scan of the lungs is recommended for people who:  Currently smoke.  Have quit within the past 15 years.  Have at least a 30-pack-year history of smoking. A pack year is smoking an average of one pack of cigarettes a day for 1 year.  Yearly screening should continue until it has been 15 years since you quit.  Yearly screening should stop if you develop a health problem that would prevent you from having lung cancer treatment. Breast Cancer  Practice breast self-awareness. This means understanding how your breasts normally appear and feel.  It also means doing regular breast self-exams. Let your health care provider know about any changes, no matter how small.  If you are in your 20s or 30s, you should have a clinical breast exam (CBE) by a health care provider every 1-3 years as part of a regular health exam.  If you are 40 or older, have a CBE every year. Also consider having a breast X-ray (mammogram) every year.  If you have a family history of breast cancer, talk to your health care provider about genetic screening.  If you are at high risk for breast cancer, talk to your health care provider  about having an MRI and a mammogram every year.  Breast cancer gene (BRCA) assessment is recommended for women who have family members with BRCA-related cancers. BRCA-related cancers include:  Breast.  Ovarian.  Tubal.  Peritoneal cancers.  Results of the assessment will determine the need for genetic counseling and BRCA1 and BRCA2 testing. Cervical Cancer  Your health care provider may recommend that you be screened regularly for cancer of the pelvic organs (ovaries, uterus, and vagina). This screening involves a pelvic examination, including checking for microscopic changes to the surface of your cervix (Pap test). You may be encouraged to have this screening done every 3 years, beginning at age 21.  For women ages 30-65, health care providers may recommend pelvic exams and Pap testing every 3 years, or they may recommend the Pap and pelvic exam, combined with testing for human papilloma virus (HPV), every 5 years. Some types of HPV increase your risk of cervical cancer. Testing for HPV may also be done on women of any age with unclear Pap test results.  Other health care providers may not recommend any screening for nonpregnant women who are considered low risk for pelvic cancer and who do not have symptoms. Ask your health care provider if a screening pelvic exam is right for you.  If you have had past treatment for cervical cancer or a condition that could lead to cancer, you need Pap tests and screening for cancer for at least 20 years after your treatment. If Pap tests have been discontinued, your risk   factors (such as having a new sexual partner) need to be reassessed to determine if screening should resume. Some women have medical problems that increase the chance of getting cervical cancer. In these cases, your health care provider may recommend more frequent screening and Pap tests. Colorectal Cancer  This type of cancer can be detected and often prevented.  Routine colorectal  cancer screening usually begins at 44 years of age and continues through 44 years of age.  Your health care provider may recommend screening at an earlier age if you have risk factors for colon cancer.  Your health care provider may also recommend using home test kits to check for hidden blood in the stool.  A small camera at the end of a tube can be used to examine your colon directly (sigmoidoscopy or colonoscopy). This is done to check for the earliest forms of colorectal cancer.  Routine screening usually begins at age 71.  Direct examination of the colon should be repeated every 5-10 years through 44 years of age. However, you may need to be screened more often if early forms of precancerous polyps or small growths are found. Skin Cancer  Check your skin from head to toe regularly.  Tell your health care provider about any new moles or changes in moles, especially if there is a change in a mole's shape or color.  Also tell your health care provider if you have a mole that is larger than the size of a pencil eraser.  Always use sunscreen. Apply sunscreen liberally and repeatedly throughout the day.  Protect yourself by wearing long sleeves, pants, a wide-brimmed hat, and sunglasses whenever you are outside. Heart disease, diabetes, and high blood pressure  High blood pressure causes heart disease and increases the risk of stroke. High blood pressure is more likely to develop in:  People who have blood pressure in the high end of the normal range (130-139/85-89 mm Hg).  People who are overweight or obese.  People who are African American.  If you are 75-46 years of age, have your blood pressure checked every 3-5 years. If you are 78 years of age or older, have your blood pressure checked every year. You should have your blood pressure measured twice--once when you are at a hospital or clinic, and once when you are not at a hospital or clinic. Record the average of the two  measurements. To check your blood pressure when you are not at a hospital or clinic, you can use:  An automated blood pressure machine at a pharmacy.  A home blood pressure monitor.  If you are between 3 years and 22 years old, ask your health care provider if you should take aspirin to prevent strokes.  Have regular diabetes screenings. This involves taking a blood sample to check your fasting blood sugar level.  If you are at a normal weight and have a low risk for diabetes, have this test once every three years after 44 years of age.  If you are overweight and have a high risk for diabetes, consider being tested at a younger age or more often. Preventing infection Hepatitis B  If you have a higher risk for hepatitis B, you should be screened for this virus. You are considered at high risk for hepatitis B if:  You were born in a country where hepatitis B is common. Ask your health care provider which countries are considered high risk.  Your parents were born in a high-risk country, and you have  not been immunized against hepatitis B (hepatitis B vaccine).  You have HIV or AIDS.  You use needles to inject street drugs.  You live with someone who has hepatitis B.  You have had sex with someone who has hepatitis B.  You get hemodialysis treatment.  You take certain medicines for conditions, including cancer, organ transplantation, and autoimmune conditions. Hepatitis C  Blood testing is recommended for:  Everyone born from 1945 through 1965.  Anyone with known risk factors for hepatitis C. Sexually transmitted infections (STIs)  You should be screened for sexually transmitted infections (STIs) including gonorrhea and chlamydia if:  You are sexually active and are younger than 44 years of age.  You are older than 44 years of age and your health care provider tells you that you are at risk for this type of infection.  Your sexual activity has changed since you were last  screened and you are at an increased risk for chlamydia or gonorrhea. Ask your health care provider if you are at risk.  If you do not have HIV, but are at risk, it may be recommended that you take a prescription medicine daily to prevent HIV infection. This is called pre-exposure prophylaxis (PrEP). You are considered at risk if:  You are sexually active and do not regularly use condoms or know the HIV status of your partner(s).  You take drugs by injection.  You are sexually active with a partner who has HIV. Talk with your health care provider about whether you are at high risk of being infected with HIV. If you choose to begin PrEP, you should first be tested for HIV. You should then be tested every 3 months for as long as you are taking PrEP. Pregnancy  If you are premenopausal and you may become pregnant, ask your health care provider about preconception counseling.  If you may become pregnant, take 400 to 800 micrograms (mcg) of folic acid every day.  If you want to prevent pregnancy, talk to your health care provider about birth control (contraception). Osteoporosis and menopause  Osteoporosis is a disease in which the bones lose minerals and strength with aging. This can result in serious bone fractures. Your risk for osteoporosis can be identified using a bone density scan.  If you are 65 years of age or older, or if you are at risk for osteoporosis and fractures, ask your health care provider if you should be screened.  Ask your health care provider whether you should take a calcium or vitamin D supplement to lower your risk for osteoporosis.  Menopause may have certain physical symptoms and risks.  Hormone replacement therapy may reduce some of these symptoms and risks. Talk to your health care provider about whether hormone replacement therapy is right for you. Follow these instructions at home:  Schedule regular health, dental, and eye exams.  Stay current with your  immunizations.  Do not use any tobacco products including cigarettes, chewing tobacco, or electronic cigarettes.  If you are pregnant, do not drink alcohol.  If you are breastfeeding, limit how much and how often you drink alcohol.  Limit alcohol intake to no more than 1 drink per day for nonpregnant women. One drink equals 12 ounces of beer, 5 ounces of wine, or 1 ounces of hard liquor.  Do not use street drugs.  Do not share needles.  Ask your health care provider for help if you need support or information about quitting drugs.  Tell your health care provider if   you often feel depressed.  Tell your health care provider if you have ever been abused or do not feel safe at home. This information is not intended to replace advice given to you by your health care provider. Make sure you discuss any questions you have with your health care provider. Document Released: 12/18/2010 Document Revised: 11/10/2015 Document Reviewed: 03/08/2015  2017 [employer]  

## 2016-07-19 ENCOUNTER — Other Ambulatory Visit: Payer: Self-pay | Admitting: Physician Assistant

## 2016-07-19 DIAGNOSIS — R718 Other abnormality of red blood cells: Secondary | ICD-10-CM

## 2016-07-19 NOTE — Progress Notes (Signed)
Have reviewed patient's labs from physical. Labs are normal except for CBC, which is borderline anemic with some changes to red blood cells indicating possible iron deficiency. Have ordered some labs to evaluate iron levels. Would also like patient to complete testing for hidden blood in stool. She may pick up an OC light kit to perform at home and return to the clinic, or she can have another appt and do it here. Waiting on TB test results. Let me know what patient prefers.

## 2016-07-20 NOTE — Progress Notes (Signed)
Patient advised. Patient states she will pick up a kit to do at home. Kit to take home left up front for patient to pick up. Patient just wanted to mention as FYI she forgot when she came in that when she was pregnant in the past she was borderline anemic and had to take iron supplement before.-aa

## 2016-07-21 LAB — CBC WITH DIFFERENTIAL/PLATELET
Basophils Absolute: 0 10*3/uL (ref 0.0–0.2)
Basos: 0 %
EOS (ABSOLUTE): 0.3 10*3/uL (ref 0.0–0.4)
Eos: 4 %
Hematocrit: 36.8 % (ref 34.0–46.6)
Hemoglobin: 11.2 g/dL (ref 11.1–15.9)
Immature Grans (Abs): 0 10*3/uL (ref 0.0–0.1)
Immature Granulocytes: 0 %
Lymphocytes Absolute: 1.7 10*3/uL (ref 0.7–3.1)
Lymphs: 25 %
MCH: 23.7 pg — ABNORMAL LOW (ref 26.6–33.0)
MCHC: 30.4 g/dL — ABNORMAL LOW (ref 31.5–35.7)
MCV: 78 fL — ABNORMAL LOW (ref 79–97)
Monocytes Absolute: 0.5 10*3/uL (ref 0.1–0.9)
Monocytes: 7 %
Neutrophils Absolute: 4.3 10*3/uL (ref 1.4–7.0)
Neutrophils: 64 %
Platelets: 338 10*3/uL (ref 150–379)
RBC: 4.73 x10E6/uL (ref 3.77–5.28)
RDW: 15.3 % (ref 12.3–15.4)
WBC: 6.7 10*3/uL (ref 3.4–10.8)

## 2016-07-21 LAB — LIPID PANEL
Chol/HDL Ratio: 2.7 ratio units (ref 0.0–4.4)
Cholesterol, Total: 160 mg/dL (ref 100–199)
HDL: 59 mg/dL (ref >39)
LDL Calculated: 87 mg/dL (ref 0–99)
Triglycerides: 72 mg/dL (ref 0–149)
VLDL Cholesterol Cal: 14 mg/dL (ref 5–40)

## 2016-07-21 LAB — COMPREHENSIVE METABOLIC PANEL
ALT: 11 IU/L (ref 0–32)
AST: 15 IU/L (ref 0–40)
Albumin/Globulin Ratio: 1.4 (ref 1.2–2.2)
Albumin: 3.7 g/dL (ref 3.5–5.5)
Alkaline Phosphatase: 53 IU/L (ref 39–117)
BUN/Creatinine Ratio: 9 (ref 9–23)
BUN: 7 mg/dL (ref 6–24)
Bilirubin Total: 0.4 mg/dL (ref 0.0–1.2)
CO2: 26 mmol/L (ref 18–29)
Calcium: 9 mg/dL (ref 8.7–10.2)
Chloride: 100 mmol/L (ref 96–106)
Creatinine, Ser: 0.77 mg/dL (ref 0.57–1.00)
GFR calc Af Amer: 109 mL/min/{1.73_m2} (ref >59)
GFR calc non Af Amer: 95 mL/min/{1.73_m2} (ref >59)
Globulin, Total: 2.7 g/dL (ref 1.5–4.5)
Glucose: 90 mg/dL (ref 65–99)
Potassium: 4.3 mmol/L (ref 3.5–5.2)
Sodium: 140 mmol/L (ref 134–144)
Total Protein: 6.4 g/dL (ref 6.0–8.5)

## 2016-07-21 LAB — QUANTIFERON IN TUBE
QFT TB AG MINUS NIL VALUE: <0 IU/mL
QUANTIFERON MITOGEN VALUE: 3.85 IU/mL
QUANTIFERON TB AG VALUE: 0.01 IU/mL
QUANTIFERON TB GOLD: NEGATIVE
Quantiferon Nil Value: 0.02 IU/mL

## 2016-07-21 LAB — TSH: TSH: 1.02 u[IU]/mL (ref 0.450–4.500)

## 2016-07-21 LAB — QUANTIFERON TB GOLD ASSAY (BLOOD)

## 2016-07-23 ENCOUNTER — Telehealth: Payer: Self-pay

## 2016-07-23 NOTE — Telephone Encounter (Signed)
Pt advised and lab results are up front for pick up. Allene DillonEmily Drozdowski, CMA

## 2016-07-23 NOTE — Telephone Encounter (Signed)
-----   Message from Trey SailorsAdriana M Pollak, [place] sent at 07/23/2016  4:30 PM EST ----- TB results came back negative. Will print copy and put in folder for patient up front for proof for work.

## 2016-08-28 ENCOUNTER — Encounter: Payer: Self-pay | Admitting: Physician Assistant

## 2016-08-28 ENCOUNTER — Ambulatory Visit (INDEPENDENT_AMBULATORY_CARE_PROVIDER_SITE_OTHER): Payer: 59 | Admitting: Physician Assistant

## 2016-08-28 ENCOUNTER — Telehealth: Payer: Self-pay

## 2016-08-28 ENCOUNTER — Other Ambulatory Visit: Payer: Self-pay | Admitting: Physician Assistant

## 2016-08-28 VITALS — BP 148/80 | HR 96 | Temp 99.5°F | Resp 16 | Wt 168.0 lb

## 2016-08-28 DIAGNOSIS — R059 Cough, unspecified: Secondary | ICD-10-CM

## 2016-08-28 DIAGNOSIS — J069 Acute upper respiratory infection, unspecified: Secondary | ICD-10-CM | POA: Diagnosis not present

## 2016-08-28 DIAGNOSIS — R05 Cough: Secondary | ICD-10-CM

## 2016-08-28 MED ORDER — BENZONATATE 100 MG PO CAPS: 100.0000 mg | ORAL_CAPSULE | Freq: Three times a day (TID) | ORAL | 0 refills | Status: AC | PRN

## 2016-08-28 MED ORDER — HYDROCODONE-HOMATROPINE 5-1.5 MG/5ML PO SYRP: 5.0000 mL | ORAL_SOLUTION | Freq: Three times a day (TID) | ORAL | 0 refills | Status: DC | PRN

## 2016-08-28 MED ORDER — AZITHROMYCIN 250 MG PO TABS: ORAL_TABLET | ORAL | 0 refills | Status: DC

## 2016-08-28 NOTE — Telephone Encounter (Signed)
Pt called because she realized her Jerilynn Somessalon Perles were not called into the pharmacy. Is [facility] for [facility] so she can take a cough suppressant at work. Allene DillonEmily Drozdowski, CMA

## 2016-08-28 NOTE — Telephone Encounter (Signed)
Sent tessalon perles Rx.

## 2016-08-28 NOTE — Patient Instructions (Signed)
Cough, Adult Coughing is a reflex that clears your throat and your airways. Coughing helps to heal and protect your lungs. It is normal to cough occasionally, but a cough that happens with other symptoms or lasts a long time may be a sign of a condition that needs treatment. A cough may last only 2-3 weeks (acute), or it may last longer than 8 weeks (chronic). What are the causes? Coughing is commonly caused by:  Breathing in substances that irritate your lungs.  A viral or bacterial respiratory infection.  Allergies.  Asthma.  Postnasal drip.  Smoking.  Acid backing up from the stomach into the esophagus (gastroesophageal reflux).  Certain medicines.  Chronic lung problems, including COPD (or rarely, lung cancer).  Other medical conditions such as heart failure.  Follow these instructions at home: Pay attention to any changes in your symptoms. Take these actions to help with your discomfort:  Take medicines only as told by your health care provider. ? If you were prescribed an antibiotic medicine, take it as told by your health care provider. Do not stop taking the antibiotic even if you start to feel better. ? Talk with your health care provider before you take a cough suppressant medicine.  Drink enough fluid to keep your urine clear or pale yellow.  If the air is dry, use a cold steam vaporizer or humidifier in your bedroom or your home to help loosen secretions.  Avoid anything that causes you to cough at work or at home.  If your cough is worse at night, try sleeping in a semi-upright position.  Avoid cigarette smoke. If you smoke, quit smoking. If you need help quitting, ask your health care provider.  Avoid caffeine.  Avoid alcohol.  Rest as needed.  Contact a health care provider if:  You have new symptoms.  You cough up pus.  Your cough does not get better after 2-3 weeks, or your cough gets worse.  You cannot control your cough with suppressant  medicines and you are losing sleep.  You develop pain that is getting worse or pain that is not controlled with pain medicines.  You have a fever.  You have unexplained weight loss.  You have night sweats. Get help right away if:  You cough up blood.  You have difficulty breathing.  Your heartbeat is very fast. This information is not intended to replace advice given to you by your health care provider. Make sure you discuss any questions you have with your health care provider. Document Released: 12/01/2010 Document Revised: 11/10/2015 Document Reviewed: 08/11/2014 Elsevier Interactive Patient Education  2017 Elsevier Inc.  

## 2016-08-28 NOTE — Telephone Encounter (Signed)
Please review. Thanks!  

## 2016-08-28 NOTE — Progress Notes (Signed)
[facility]  Chief Complaint  Patient presents with  . URI  . Fever    Subjective:    Patient ID: Denise Stephens, female    DOB: 25-Aug-1970, 46 y.o.   MRN: [id number]  Upper Respiratory Infection: Denise Stephens is a 46 y.o. female complaining of symptoms of a URI. 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 Symptoms include bilateral ear pain, congestion, cough, fever and sore throat. She is having watery eyes and rhinorrhea. No chest pain, diaphoresis, arm or jaw pain. Onset of symptoms was 2 days ago, gradually worsening since that time. She also c/o bilateral ear pressure/pain, achiness, fever The highest temp was 102.4, lightheadedness and nasal congestion for the past 2 bilateral .  She is drinking plenty of fluids. Evaluation to date: none. Treatment to date: cough suppressants and decongestants. The treatment has provided minimal.   Review of Systems  Constitutional: Positive for chills, diaphoresis, fatigue and fever. Negative for activity change, appetite change and unexpected weight change.  HENT: Positive for congestion, ear pain, postnasal drip, rhinorrhea and sore throat. Negative for ear discharge, hearing loss, nosebleeds, sinus pain, sinus pressure, tinnitus and trouble swallowing.   Eyes: Positive for discharge. Negative for photophobia, pain, redness, itching and visual disturbance.  Respiratory: Positive for cough, chest tightness, shortness of breath and wheezing. Negative for apnea, choking and stridor.   Gastrointestinal: Negative.   Musculoskeletal: Positive for back pain.  Neurological: Positive for light-headedness and headaches. Negative for dizziness.       Objective:   BP (!) 148/80 (BP Location: Left Arm, Patient Position: Sitting, Cuff Size: Normal)   Pulse 96   Temp 99.5 F (37.5 C) (Oral)   Resp 16   Wt 168 lb (76.2 kg)   LMP 08/02/2016  (Approximate)   BMI 31.74 kg/m   Patient Active Problem List   Diagnosis Date Noted  . Blood in the urine 05/04/2015  . IBS (irritable bowel syndrome) 05/04/2015  . Excess, menstruation 05/04/2015  . Strain of trapezius muscle 05/04/2015  . Candida vaginitis 05/04/2015  . Vitamin D deficiency 05/04/2015    Outpatient Encounter Prescriptions as of 08/28/2016  Medication Sig  . azithromycin (ZITHROMAX) 250 MG tablet Take two pills on day 1, one pill on following four days.  Marland Kitchen. HYDROcodone-homatropine (HYCODAN) 5-1.5 MG/5ML syrup Take 5 mLs by mouth every 8 (eight) hours as needed for cough.   No facility-administered encounter medications on file as of 08/28/2016.     Allergies  Allergen Reactions  . Sulfa Antibiotics        Physical Exam  Constitutional: She appears well-developed and well-nourished. She appears ill. No distress.  HENT:  Right Ear: External ear normal.  Left Ear: External ear normal.  Nose: Rhinorrhea present.  Mouth/Throat: Oropharynx is clear and moist. No oropharyngeal exudate.  Eyes: Right eye exhibits discharge. Left eye exhibits discharge.  Neck: Neck supple.  Cardiovascular: Normal rate and regular rhythm.   Pulmonary/Chest: Effort normal and breath sounds normal. No respiratory distress. She has no wheezes. She has no rales.  Lymphadenopathy:    She has no cervical adenopathy.  Skin: Skin is warm and dry.  Psychiatric: She has a normal mood and affect. Her behavior is normal.       Assessment & Plan:   1. URI with cough and congestion  Patient exposed to whooping cough, concerned about this. Is UTD on Tdap. Will give  z-pack 2/2 patient concern. Exam is benign today. Provided cough medication, counseled risks.   - azithromycin (ZITHROMAX) 250 MG tablet; Take two pills on day 1, one pill on following four days.  Dispense: 6 tablet; Refill: 0 - HYDROcodone-homatropine (HYCODAN) 5-1.5 MG/5ML syrup; Take 5 mLs by mouth every 8 (eight) hours as  needed for cough.  Dispense: 120 mL; Refill: 0   Recommend rest, fluids, frequent hand washing. Work note provided.  Return if symptoms worsen or fail to improve.   Patient Instructions  Cough, Adult Coughing is a reflex that clears your throat and your airways. Coughing helps to heal and protect your lungs. It is normal to cough occasionally, but a cough that happens with other symptoms or lasts a long time may be a sign of a condition that needs treatment. A cough may last only 2-3 weeks (acute), or it may last longer than 8 weeks (chronic). What are the causes? Coughing is commonly caused by:  Breathing in substances that irritate your lungs.  A viral or bacterial respiratory infection.  Allergies.  Asthma.  Postnasal drip.  Smoking.  Acid backing up from the stomach into the esophagus (gastroesophageal reflux).  Certain medicines.  Chronic lung problems, including COPD (or rarely, lung cancer).  Other medical conditions such as heart failure. Follow these instructions at home: Pay attention to any changes in your symptoms. Take these actions to help with your discomfort:  Take medicines only as told by your health care provider.  If you were prescribed an antibiotic medicine, take it as told by your health care provider. Do not stop taking the antibiotic even if you start to feel better.  Talk with your health care provider before you take a cough suppressant medicine.  Drink enough fluid to keep your urine clear or pale yellow.  If the air is dry, use a cold steam vaporizer or humidifier in your bedroom or your home to help loosen secretions.  Avoid anything that causes you to cough at work or at home.  If your cough is worse at night, try sleeping in a semi-upright position.  Avoid cigarette smoke. If you smoke, quit smoking. If you need help quitting, ask your health care provider.  Avoid caffeine.  Avoid alcohol.  Rest as needed. Contact a health care  provider if:  You have new symptoms.  You cough up pus.  Your cough does not get better after 2-3 weeks, or your cough gets worse.  You cannot control your cough with suppressant medicines and you are losing sleep.  You develop pain that is getting worse or pain that is not controlled with pain medicines.  You have a fever.  You have unexplained weight loss.  You have night sweats. Get help right away if:  You cough up blood.  You have difficulty breathing.  Your heartbeat is very fast. This information is not intended to replace advice given to you by your health care provider. Make sure you discuss any questions you have with your health care provider. Document Released: 12/01/2010 Document Revised: 11/10/2015 Document Reviewed: 08/11/2014 Elsevier Interactive Patient Education  2017 [employer].     The entirety of the information documented in the History of Present Illness, Review of Systems and Physical Exam were personally obtained by me. Portions of this information were initially documented by Kavin Leech, CMA and reviewed by me for thoroughness and accuracy.

## 2017-03-13 ENCOUNTER — Encounter: Payer: Self-pay | Admitting: *Deleted

## 2017-03-13 NOTE — Progress Notes (Signed)
l °

## 2017-09-11 DIAGNOSIS — G43909 Migraine, unspecified, not intractable, without status migrainosus: Secondary | ICD-10-CM | POA: Diagnosis not present

## 2018-02-12 ENCOUNTER — Ambulatory Visit (INDEPENDENT_AMBULATORY_CARE_PROVIDER_SITE_OTHER): Payer: 59 | Admitting: Family Medicine

## 2018-02-12 ENCOUNTER — Encounter: Payer: Self-pay | Admitting: Family Medicine

## 2018-02-12 VITALS — BP 114/78 | HR 82 | Temp 98.7°F | Wt 179.0 lb

## 2018-02-12 DIAGNOSIS — J0100 Acute maxillary sinusitis, unspecified: Secondary | ICD-10-CM | POA: Diagnosis not present

## 2018-02-12 MED ORDER — AMOXICILLIN-POT CLAVULANATE 875-125 MG PO TABS: 1.0000 | ORAL_TABLET | Freq: Two times a day (BID) | ORAL | 0 refills | Status: AC

## 2018-02-12 NOTE — Patient Instructions (Signed)
Start flonase 2 sprays each side daily   This weekend if not better, start taking Augmentin for 1 week   Sinusitis, Adult Sinusitis is soreness and inflammation of your sinuses. Sinuses are hollow spaces in the bones around your face. Your sinuses are located:  Around your eyes.  In the middle of your forehead.  Behind your nose.  In your cheekbones.  Your sinuses and nasal passages are lined with a stringy fluid (mucus). Mucus normally drains out of your sinuses. When your nasal tissues become inflamed or swollen, the mucus can become trapped or blocked so air cannot flow through your sinuses. This allows bacteria, viruses, and funguses to grow, which leads to infection. Sinusitis can develop quickly and last for 7?10 days (acute) or for more than 12 weeks (chronic). Sinusitis often develops after a cold. What are the causes? This condition is caused by anything that creates swelling in the sinuses or stops mucus from draining, including:  Allergies.  Asthma.  Bacterial or viral infection.  Abnormally shaped bones between the nasal passages.  Nasal growths that contain mucus (nasal polyps).  Narrow sinus openings.  Pollutants, such as chemicals or irritants in the air.  A foreign object stuck in the nose.  A fungal infection. This is rare.  What increases the risk? The following factors may make you more likely to develop this condition:  Having allergies or asthma.  Having had a recent cold or respiratory tract infection.  Having structural deformities or blockages in your nose or sinuses.  Having a weak immune system.  Doing a lot of swimming or diving.  Overusing nasal sprays.  Smoking.  What are the signs or symptoms? The main symptoms of this condition are pain and a feeling of pressure around the affected sinuses. Other symptoms include:  Upper toothache.  Earache.  Headache.  Bad breath.  Decreased sense of smell and taste.  A cough that  may get worse at night.  Fatigue.  Fever.  Thick drainage from your nose. The drainage is often green and it may contain pus (purulent).  Stuffy nose or congestion.  Postnasal drip. This is when extra mucus collects in the throat or back of the nose.  Swelling and warmth over the affected sinuses.  Sore throat.  Sensitivity to light.  How is this diagnosed? This condition is diagnosed based on symptoms, a medical history, and a physical exam. To find out if your condition is acute or chronic, your health care provider may:  Look in your nose for signs of nasal polyps.  Tap over the affected sinus to check for signs of infection.  View the inside of your sinuses using an imaging device that has a light attached (endoscope).  If your health care provider suspects that you have chronic sinusitis, you may also:  Be tested for allergies.  Have a sample of mucus taken from your nose (nasal culture) and checked for bacteria.  Have a mucus sample examined to see if your sinusitis is related to an allergy.  If your sinusitis does not respond to treatment and it lasts longer than 8 weeks, you may have an MRI or CT scan to check your sinuses. These scans also help to determine how severe your infection is. In rare cases, a bone biopsy may be done to rule out more serious types of fungal sinus disease. How is this treated? Treatment for sinusitis depends on the cause and whether your condition is chronic or acute. If a virus is causing  your sinusitis, your symptoms will go away on their own within 10 days. You may be given medicines to relieve your symptoms, including:  Topical nasal decongestants. They shrink swollen nasal passages and let mucus drain from your sinuses.  Antihistamines. These drugs block inflammation that is triggered by allergies. This can help to ease swelling in your nose and sinuses.  Topical nasal corticosteroids. These are nasal sprays that ease inflammation  and swelling in your nose and sinuses.  Nasal saline washes. These rinses can help to get rid of thick mucus in your nose.  If your condition is caused by bacteria, you will be given an antibiotic medicine. If your condition is caused by a fungus, you will be given an antifungal medicine. Surgery may be needed to correct underlying conditions, such as narrow nasal passages. Surgery may also be needed to remove polyps. Follow these instructions at home: Medicines  Take, use, or apply over-the-counter and prescription medicines only as told by your health care provider. These may include nasal sprays.  If you were prescribed an antibiotic medicine, take it as told by your health care provider. Do not stop taking the antibiotic even if you start to feel better. Hydrate and Humidify  Drink enough water to keep your urine clear or pale yellow. Staying hydrated will help to thin your mucus.  Use a cool mist humidifier to keep the humidity level in your home above 50%.  Inhale steam for 10-15 minutes, 3-4 times a day or as told by your health care provider. You can do this in the bathroom while a hot shower is running.  Limit your exposure to cool or dry air. Rest  Rest as much as possible.  Sleep with your head raised (elevated).  Make sure to get enough sleep each night. General instructions  Apply a warm, moist washcloth to your face 3-4 times a day or as told by your health care provider. This will help with discomfort.  Wash your hands often with soap and water to reduce your exposure to viruses and other germs. If soap and water are not available, use hand sanitizer.  Do not smoke. Avoid being around people who are smoking (secondhand smoke).  Keep all follow-up visits as told by your health care provider. This is important. Contact a health care provider if:  You have a fever.  Your symptoms get worse.  Your symptoms do not improve within 10 days. Get help right away  if:  You have a severe headache.  You have persistent vomiting.  You have pain or swelling around your face or eyes.  You have vision problems.  You develop confusion.  Your neck is stiff.  You have trouble breathing. This information is not intended to replace advice given to you by your health care provider. Make sure you discuss any questions you have with your health care provider. Document Released: 06/04/2005 Document Revised: 01/29/2016 Document Reviewed: 03/30/2015 Elsevier Interactive Patient Education  [employer]2018 Elsevier Inc.

## 2018-02-12 NOTE — Progress Notes (Signed)
Patient: Denise Stephens Female    DOB: 01-28-1971   47 y.o.   MRN: [id number] Visit Date: 02/12/2018  Today's Provider: Shirlee LatchAngela Bacigalupo, MD   Chief Complaint  Patient presents with  . URI   Subjective:    I, Presley RaddleNikki Walston, CMA, am acting as a scribe for Shirlee LatchAngela Bacigalupo, MD.   URI   This is a new problem. Episode onset: Saturday. The problem has been gradually worsening. The maximum temperature recorded prior to her arrival was 101 - 101.9 F. Associated symptoms include congestion, coughing and rhinorrhea. Pertinent negatives include no sinus pain. Chest pain: chest tightness. Associated symptoms comments: Fever, eye drainage. Treatments tried: Mucinex, Cold & Flu, Sinus Relief and Robitussin DM. The treatment provided mild relief.    Allergies  Allergen Reactions  . Sulfa Antibiotics     No current outpatient medications on file.  Review of Systems  Constitutional: Negative.   HENT: Positive for congestion and rhinorrhea. Negative for sinus pain.   Respiratory: Positive for cough.   Cardiovascular: Negative.  Chest pain: chest tightness.  Gastrointestinal: Negative.     Social History   Tobacco Use  . Smoking status: Never Smoker  . Smokeless tobacco: Never Used  Substance Use Topics  . Alcohol use: No    Alcohol/week: 0.0 standard drinks   Objective:   BP 114/78 (BP Location: Right Arm, Patient Position: Sitting, Cuff Size: Normal)   Pulse 82   Temp 98.7 F (37.1 C) (Oral)   Wt 179 lb (81.2 kg)   SpO2 99%   BMI 33.82 kg/m  Vitals:   02/12/18 1430  BP: 114/78  Pulse: 82  Temp: 98.7 F (37.1 C)  TempSrc: Oral  SpO2: 99%  Weight: 179 lb (81.2 kg)     Physical Exam  Constitutional: She is oriented to person, place, and time. She appears well-developed and well-nourished. No distress.  HENT:  Head: Normocephalic and atraumatic.  Right Ear: Tympanic membrane, external ear and ear canal normal.  Left Ear: Tympanic membrane, external  ear and ear canal normal.  Nose: Mucosal edema present. No rhinorrhea. Right sinus exhibits maxillary sinus tenderness. Right sinus exhibits no frontal sinus tenderness. Left sinus exhibits no maxillary sinus tenderness and no frontal sinus tenderness.  Mouth/Throat: Oropharynx is clear and moist. No oropharyngeal exudate.  Eyes: Pupils are equal, round, and reactive to light. Conjunctivae are normal. No scleral icterus.  Neck: Neck supple. No thyromegaly present.  Cardiovascular: Normal rate, regular rhythm, normal heart sounds and intact distal pulses.  No murmur heard. Pulmonary/Chest: Effort normal and breath sounds normal. No respiratory distress. She has no wheezes. She has no rales.  Musculoskeletal: She exhibits no edema.  Lymphadenopathy:    She has no cervical adenopathy.  Neurological: She is alert and oriented to person, place, and time.  Skin: Skin is warm and dry. Capillary refill takes less than 2 seconds. No rash noted.  Psychiatric: She has a normal mood and affect. Her behavior is normal.  Vitals reviewed.       Assessment & Plan:   1. Acute non-recurrent maxillary sinusitis - symptoms and exam c/w sinusitis - most likely viral given time course, but it is accompanied by fever and is localized and worsening, so may be bacterial  - no evidence of strep pharyngitis, CAP, AOM, or other bacterial infection - discussed symptomatic management, natural course, and return precautions - start flonase - after it has been present x1 wk, if not better, take 7d course of  Augmentin for bacterial sinusitis    Meds ordered this encounter  Medications  . amoxicillin-clavulanate (AUGMENTIN) 875-125 MG tablet    Sig: Take 1 tablet by mouth 2 (two) times daily for 7 days.    Dispense:  14 tablet    Refill:  0     Return if symptoms worsen or fail to improve.   The entirety of the information documented in the History of Present Illness, Review of Systems and Physical Exam were  personally obtained by me. Portions of this information were initially documented by Presley Raddle, CMA and reviewed by me for thoroughness and accuracy.    Erasmo Downer, MD, MPH [facility] 02/12/2018 2:52 PM

## 2018-02-18 ENCOUNTER — Telehealth: Payer: Self-pay | Admitting: Family Medicine

## 2018-02-18 MED ORDER — FLUCONAZOLE 150 MG PO TABS: 150.0000 mg | ORAL_TABLET | Freq: Once | ORAL | 0 refills | Status: AC

## 2018-02-18 NOTE — Telephone Encounter (Signed)
Patient been on Augmentin since last Wednesday and now getting a yeast infection.  Wants something called in to [facility] in [place].   Please let her know when this is done.

## 2018-02-18 NOTE — Telephone Encounter (Signed)
Patient requesting medication to treat yeast infection. Patient was son on 02/12/18 by Dr.B.

## 2018-02-24 ENCOUNTER — Encounter: Payer: Self-pay | Admitting: Physician Assistant

## 2018-02-24 ENCOUNTER — Ambulatory Visit (INDEPENDENT_AMBULATORY_CARE_PROVIDER_SITE_OTHER): Payer: 59 | Admitting: Physician Assistant

## 2018-02-24 VITALS — BP 112/70 | HR 75 | Temp 98.0°F | Resp 16 | Wt 182.0 lb

## 2018-02-24 DIAGNOSIS — J40 Bronchitis, not specified as acute or chronic: Secondary | ICD-10-CM | POA: Diagnosis not present

## 2018-02-24 MED ORDER — BENZONATATE 200 MG PO CAPS: 200.0000 mg | ORAL_CAPSULE | Freq: Two times a day (BID) | ORAL | 0 refills | Status: DC | PRN

## 2018-02-24 MED ORDER — PREDNISONE 10 MG (21) PO TBPK: ORAL_TABLET | ORAL | 0 refills | Status: DC

## 2018-02-24 MED ORDER — ALBUTEROL SULFATE HFA 108 (90 BASE) MCG/ACT IN AERS: 2.0000 | INHALATION_SPRAY | Freq: Four times a day (QID) | RESPIRATORY_TRACT | 0 refills | Status: DC | PRN

## 2018-02-24 NOTE — Progress Notes (Signed)
Patient: Denise Stephens Female    DOB: August 29, 1970   47 y.o.   MRN: [id number] Visit Date: 02/24/2018  Today's Provider: Margaretann Loveless, PA-C   Chief Complaint  Patient presents with  . Cough   Subjective:    HPI  Patient here today with c/o cough. Patient was seen on 08/28 for similar symptoms and was treated with Augmentin 7d course.Patient reports she was feeling better. Reports that 2 days after finishing the antibiotic she started to have the chest tightness radiating to the back. Associated symptoms:runny nose, cough,wheezing, SOB when coughing. Treatments tried: Robitussin and Delsym, honey.     Allergies  Allergen Reactions  . Sulfa Antibiotics     No current outpatient medications on file.  Review of Systems  Constitutional: Positive for fatigue. Negative for chills and fever.  HENT: Positive for congestion, rhinorrhea and voice change. Negative for ear pain, postnasal drip, sinus pressure, sinus pain, sneezing, sore throat and trouble swallowing.   Respiratory: Positive for cough, chest tightness, shortness of breath and wheezing.   Cardiovascular: Negative for chest pain, palpitations and leg swelling.  Neurological: Positive for light-headedness and headaches.    Social History   Tobacco Use  . Smoking status: Never Smoker  . Smokeless tobacco: Never Used  Substance Use Topics  . Alcohol use: No    Alcohol/week: 0.0 standard drinks   Objective:   BP 112/70 (BP Location: Left Arm, Patient Position: Sitting, Cuff Size: Normal)   Pulse 75   Temp 98 F (36.7 C) (Oral)   Resp 16   Wt 182 lb (82.6 kg)   SpO2 96%   BMI 34.39 kg/m  Vitals:   02/24/18 1519  BP: 112/70  Pulse: 75  Resp: 16  Temp: 98 F (36.7 C)  TempSrc: Oral  SpO2: 96%  Weight: 182 lb (82.6 kg)     Physical Exam  Constitutional: She appears well-developed and well-nourished. No distress.  HENT:  Head: Normocephalic and atraumatic.  Right Ear: Hearing,  tympanic membrane, external ear and ear canal normal.  Left Ear: Hearing, tympanic membrane, external ear and ear canal normal.  Nose: Nose normal.  Mouth/Throat: Uvula is midline, oropharynx is clear and moist and mucous membranes are normal. No oropharyngeal exudate.  Eyes: Pupils are equal, round, and reactive to light. Conjunctivae are normal. Right eye exhibits no discharge. Left eye exhibits no discharge. No scleral icterus.  Neck: Normal range of motion. Neck supple. No tracheal deviation present. No thyromegaly present.  Cardiovascular: Normal rate, regular rhythm and normal heart sounds. Exam reveals no gallop and no friction rub.  No murmur heard. Pulmonary/Chest: Effort normal. No stridor. No respiratory distress. She has wheezes. She has no rales.  Lymphadenopathy:    She has no cervical adenopathy.  Skin: Skin is warm and dry. She is not diaphoretic.  Vitals reviewed.       Assessment & Plan:     1. Bronchitis Suspect bronchitis as secondary from previous sinus infection. Do not feel that it is infectious at this time as she reports she is coughing up clear mucous. I do suspect to be more inflammatory response secondary to the previous sinus infection. Will give prednisone for inflammation, albuterol inhaler for bronchodilation and tessalon for cough. Push fluids. Call if symptoms worsen.  - predniSONE (STERAPRED UNI-PAK 21 TAB) 10 MG (21) TBPK tablet; 6 day taper; take as directed on package instructions  Dispense: 21 tablet; Refill: 0 - albuterol (PROVENTIL HFA;VENTOLIN HFA) 108 (  90 Base) MCG/ACT inhaler; Inhale 2 puffs into the lungs every 6 (six) hours as needed for wheezing or shortness of breath.  Dispense: 1 Inhaler; Refill: 0 - benzonatate (TESSALON) 200 MG capsule; Take 1 capsule (200 mg total) by mouth 2 (two) times daily as needed for cough.  Dispense: 20 capsule; Refill: 0       Margaretann Loveless, PA-C  [facility]

## 2018-02-24 NOTE — Patient Instructions (Signed)

## 2018-03-03 ENCOUNTER — Telehealth: Payer: Self-pay | Admitting: Physician Assistant

## 2018-03-03 DIAGNOSIS — R05 Cough: Secondary | ICD-10-CM

## 2018-03-03 DIAGNOSIS — J40 Bronchitis, not specified as acute or chronic: Secondary | ICD-10-CM

## 2018-03-03 DIAGNOSIS — R059 Cough, unspecified: Secondary | ICD-10-CM

## 2018-03-03 MED ORDER — BENZONATATE 200 MG PO CAPS: 200.0000 mg | ORAL_CAPSULE | Freq: Two times a day (BID) | ORAL | 0 refills | Status: DC | PRN

## 2018-03-03 NOTE — Telephone Encounter (Signed)
CXR ordered 

## 2018-03-03 NOTE — Telephone Encounter (Signed)
1. Pt contacted office for refill request on the following medications:  benzonatate (TESSALON) 200 MG capsule   [facility]  Last Rx: 02/24/18 LOV: 02/24/18  2. Pt stated she finished her predniSONE (STERAPRED UNI-PAK 21 TAB) 10 MG (21) TBPK tablet on 03/02/18 and she still has a cough and feels it is in her upper chest. Pt stated that the cough has improved some but not much. Pt stated her cough is productive but she gets very little mucus up. Pt wanted to discuss if she should have a chest x-ray or what she should try next. Please advise. Thanks TNP

## 2018-03-03 NOTE — Telephone Encounter (Signed)
Please Review

## 2018-03-03 NOTE — Telephone Encounter (Signed)
Patient advised.

## 2018-03-04 ENCOUNTER — Ambulatory Visit
Admission: RE | Admit: 2018-03-04 | Discharge: 2018-03-04 | Disposition: A | Discharge status: Home / Self Care | Payer: 59 | Source: Ambulatory Visit | Attending: Physician Assistant | Admitting: Physician Assistant

## 2018-03-04 DIAGNOSIS — R05 Cough: Secondary | ICD-10-CM | POA: Insufficient documentation

## 2018-03-04 DIAGNOSIS — R059 Cough, unspecified: Secondary | ICD-10-CM

## 2018-03-05 ENCOUNTER — Encounter: Payer: Self-pay | Admitting: Family Medicine

## 2018-03-05 ENCOUNTER — Telehealth: Payer: Self-pay

## 2018-03-05 ENCOUNTER — Ambulatory Visit (INDEPENDENT_AMBULATORY_CARE_PROVIDER_SITE_OTHER): Payer: 59 | Admitting: Family Medicine

## 2018-03-05 VITALS — BP 126/88 | HR 76 | Temp 98.3°F | Wt 182.6 lb

## 2018-03-05 DIAGNOSIS — J40 Bronchitis, not specified as acute or chronic: Secondary | ICD-10-CM | POA: Diagnosis not present

## 2018-03-05 NOTE — Patient Instructions (Signed)

## 2018-03-05 NOTE — Telephone Encounter (Signed)
NA

## 2018-03-05 NOTE — Progress Notes (Signed)
Patient: Denise Stephens Female    DOB: [birth year]   47 y.o.   MRN: [id number] Visit Date: 03/05/2018  Today's Provider: Shirlee LatchAngela Dietra Stokely, MD   Chief Complaint  Patient presents with  . Bronchitis    follow up    Subjective:    I, Presley RaddleNikki Walston, CMA, am acting as a scribe for Shirlee LatchAngela Deniz Hannan, MD.   HPI Bronchitis:  Patient presents for a follow up. Last OV was on 02/24/2018. Patient started Albuterol inhaler, Benzonatate 200 mg, and Prednisone 10 mg. On 03/03/18 patient called to report that she completed the Prednisone and still was coughing and experiencing chest tightness. Felt like she was getting better when she finished prednisone, but seems to have flared up some more in last 2 days.  CXR was ordered. Results were normal. Patient presents today with ongoing SOB, cough, chest tightness, pain with deep breathing, wheezing and lightheaded after completing treatment from OV on 02/24/2018.  Has been consistently taking albuterol q6 hours - helps some.    Allergies  Allergen Reactions  . Sulfa Antibiotics      Current Outpatient Medications:  .  albuterol (PROVENTIL HFA;VENTOLIN HFA) 108 (90 Base) MCG/ACT inhaler, Inhale 2 puffs into the lungs every 6 (six) hours as needed for wheezing or shortness of breath., Disp: 1 Inhaler, Rfl: 0 .  benzonatate (TESSALON) 200 MG capsule, Take 1 capsule (200 mg total) by mouth 2 (two) times daily as needed for cough., Disp: 20 capsule, Rfl: 0  Review of Systems  Constitutional: Negative.   HENT: Positive for congestion.   Respiratory: Positive for cough, shortness of breath and wheezing.   Cardiovascular: Chest pain: chest tightness   Musculoskeletal: Negative.   Neurological: Positive for light-headedness.    Social History   Tobacco Use  . Smoking status: Never Smoker  . Smokeless tobacco: Never Used  Substance Use Topics  . Alcohol use: No    Alcohol/week: 0.0 standard drinks   Objective:   BP 126/88 (BP  Location: Right Arm, Patient Position: Sitting, Cuff Size: Normal)   Pulse 76   Temp 98.3 F (36.8 C) (Oral)   Wt 182 lb 9.6 oz (82.8 kg)   LMP 02/18/2018   SpO2 99%   BMI 34.50 kg/m  Vitals:   03/05/18 0944  BP: 126/88  Pulse: 76  Temp: 98.3 F (36.8 C)  TempSrc: Oral  SpO2: 99%  Weight: 182 lb 9.6 oz (82.8 kg)     Physical Exam  Constitutional: She is oriented to person, place, and time. She appears well-developed and well-nourished. No distress.  HENT:  Head: Normocephalic and atraumatic.  Right Ear: Tympanic membrane, external ear and ear canal normal.  Left Ear: Tympanic membrane, external ear and ear canal normal.  Nose: Nose normal.  Mouth/Throat: Uvula is midline, oropharynx is clear and moist and mucous membranes are normal. No oropharyngeal exudate.  Eyes: Pupils are equal, round, and reactive to light. Conjunctivae are normal. Right eye exhibits no discharge. Left eye exhibits no discharge. No scleral icterus.  Neck: Neck supple. No thyromegaly present.  Cardiovascular: Normal rate, regular rhythm, normal heart sounds and intact distal pulses.  No murmur heard. Pulmonary/Chest: Effort normal. No respiratory distress.  Diffuse rhonchi, bases>apices  Musculoskeletal: She exhibits no edema.  Lymphadenopathy:    She has no cervical adenopathy.  Neurological: She is alert and oriented to person, place, and time.  Skin: Skin is warm and dry. Capillary refill takes less than 2 seconds.  Psychiatric: She  has a normal mood and affect. Her behavior is normal.  Vitals reviewed.       Assessment & Plan:   1. Bronchitis -Symptoms and exam consistent with acute bronchitis - She did have some improvement with prednisone and albuterol - She does not seem to have any infectious process currently that would warrant further treatment with antibiotics -I do not think that further prednisone dosing would help -Discussed natural course and mucus trapping that can occur with  bronchitis with the patient -Discussed using Mucinex to help clear this - Reviewed chest x-ray from yesterday with the patient that shows no evidence of pneumonia -Discussed return precautions   Return if symptoms worsen or fail to improve.   The entirety of the information documented in the History of Present Illness, Review of Systems and Physical Exam were personally obtained by me. Portions of this information were initially documented by Presley Raddle, CMA and reviewed by me for thoroughness and accuracy.    Erasmo Downer, MD, MPH [facility] 03/05/2018 9:58 AM

## 2018-03-05 NOTE — Telephone Encounter (Signed)
-----   Message from Margaretann LovelessJennifer M Burnette, PA-C sent at 03/05/2018  9:15 AM EDT ----- CXR normal. No bronchitis or pneumonia noted.

## 2018-03-06 NOTE — Telephone Encounter (Signed)
[email or web address]

## 2018-03-06 NOTE — Telephone Encounter (Signed)
-----   Message from Margaretann LovelessJennifer M Burnette, PA-C sent at 03/05/2018  9:15 AM EDT ----- CXR normal. No bronchitis or pneumonia noted.

## 2018-03-07 NOTE — Telephone Encounter (Signed)
Patient was seen on 03/05/18 and was advised during OV.

## 2018-03-21 ENCOUNTER — Ambulatory Visit (INDEPENDENT_AMBULATORY_CARE_PROVIDER_SITE_OTHER): Payer: 59 | Admitting: Family Medicine

## 2018-03-21 ENCOUNTER — Other Ambulatory Visit: Payer: Self-pay

## 2018-03-21 ENCOUNTER — Encounter: Payer: Self-pay | Admitting: Family Medicine

## 2018-03-21 VITALS — BP 118/86 | HR 80 | Temp 99.0°F | Ht 61.0 in | Wt 185.4 lb

## 2018-03-21 DIAGNOSIS — J069 Acute upper respiratory infection, unspecified: Secondary | ICD-10-CM

## 2018-03-21 NOTE — Progress Notes (Signed)
  Subjective:     Patient ID: Denise Stephens, female   DOB: [birth year], 47 y.o.   MRN: [id number] Chief Complaint  Patient presents with  . Cough    congestion with clear to thick white.  pt states the cough is worse from her visit on 02/07/18 and 02/24/18 and has been taking mucines and using the ventolin inhaler.  also tessolon pearls medication isn't working.  patient states that the drainage started on 03/19/18.     HPI States she felt better from prior bronchitis 9/30 with lingering cough. Subsequently developed increased sinus congestion and cough over the last few days. 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  Review of Systems     Objective:   Physical Exam  Constitutional: She appears well-developed and well-nourished. No distress.  Ears: T.M's intact without inflammation Throat: no tonsillar enlargement or exudate Neck: no cervical adenopathy Lungs: clear     Assessment:    1. Acute URI: new onset    Plan:    Discussed use of Mucinex D for congestion, Delsym for cough, and Benadryl for postnasal drainage. To call next week if sinuses not improving.

## 2018-03-21 NOTE — Patient Instructions (Signed)
Discussed use of Mucinex D for congestion, Delsym for cough, and Benadryl for postnasal drainage. Let us know if sinuses are not improving at the beginning of next week.

## 2018-04-03 ENCOUNTER — Ambulatory Visit (INDEPENDENT_AMBULATORY_CARE_PROVIDER_SITE_OTHER): Payer: 59 | Admitting: Family Medicine

## 2018-04-03 ENCOUNTER — Encounter: Payer: Self-pay | Admitting: Family Medicine

## 2018-04-03 VITALS — BP 130/90 | HR 97 | Temp 97.9°F | Wt 184.4 lb

## 2018-04-03 DIAGNOSIS — J019 Acute sinusitis, unspecified: Secondary | ICD-10-CM | POA: Diagnosis not present

## 2018-04-03 DIAGNOSIS — J3089 Other allergic rhinitis: Secondary | ICD-10-CM

## 2018-04-03 MED ORDER — MONTELUKAST SODIUM 10 MG PO TABS: 10.0000 mg | ORAL_TABLET | Freq: Every day | ORAL | 1 refills | Status: DC

## 2018-04-03 MED ORDER — DOXYCYCLINE HYCLATE 100 MG PO TABS: 100.0000 mg | ORAL_TABLET | Freq: Two times a day (BID) | ORAL | 0 refills | Status: AC

## 2018-04-03 MED ORDER — PREDNISONE 10 MG PO TABS: ORAL_TABLET | ORAL | 0 refills | Status: AC

## 2018-04-03 NOTE — Progress Notes (Signed)
Patient: Denise Stephens Female    DOB: [birth year]   47 y.o.   MRN: [id number] Visit Date: 04/03/2018  Today's Provider: Mila Merry, MD   Chief Complaint  Patient presents with  . URI   Subjective:    Upper Respiratory Infection Patient complains of symptoms of a URI. Symptoms include achiness, congestion, cough described as productive, facial pain, lightheadedness, nasal congestion, no  fever, post nasal drip, productive cough with  yellow and green colored sputum and sinus pressure. Onset of symptoms was 6 days ago, and has been gradually worsening since that time. Treatment to date: cough suppressants and decongestants. She was initially seen on 02-12-2018 for sinus infections and treated with Augmentin with no improvement. She returned 9-9 and prescribed prednisone and albuterol for bronchitis, and states she improved, but sx returned as soon as she finished prednisone. She tried using fluticasone nasal spray for a couple of days which didn't help . She returned 10-4- and advised to use OTC Mucinex D Delsym and benadryl which all helped, but sx never resolved. Her primary sx now is pressure in her sinuses and dark green nasal discharge.      Allergies  Allergen Reactions  . Sulfa Antibiotics      Current Outpatient Medications:  .  albuterol (PROVENTIL HFA;VENTOLIN HFA) 108 (90 Base) MCG/ACT inhaler, Inhale 2 puffs into the lungs every 6 (six) hours as needed for wheezing or shortness of breath. (Patient not taking: Reported on 04/03/2018), Disp: 1 Inhaler, Rfl: 0 .  benzonatate (TESSALON) 200 MG capsule, Take 1 capsule (200 mg total) by mouth 2 (two) times daily as needed for cough. (Patient not taking: Reported on 04/03/2018), Disp: 20 capsule, Rfl: 0  Review of Systems  Constitutional: Negative.  Negative for chills and fever.  HENT: Positive for congestion, facial swelling and postnasal drip. Negative for sinus pressure, sinus pain, sneezing and sore  throat.   Eyes: Positive for discharge.  Respiratory: Positive for cough. Negative for chest tightness, shortness of breath and wheezing.   Cardiovascular: Negative.   Gastrointestinal: Negative.   Musculoskeletal: Negative.   Allergic/Immunologic: Negative.     Social History   Tobacco Use  . Smoking status: Never Smoker  . Smokeless tobacco: Never Used  Substance Use Topics  . Alcohol use: No    Alcohol/week: 0.0 standard drinks   Objective:   BP 130/90 (BP Location: Left Arm, Patient Position: Sitting, Cuff Size: Normal)   Pulse 97   Temp 97.9 F (36.6 C) (Oral)   Wt 184 lb 6.4 oz (83.6 kg)   LMP 03/18/2018   SpO2 97%   BMI 34.84 kg/m  Vitals:   04/03/18 1102  BP: 130/90  Pulse: 97  Temp: 97.9 F (36.6 C)  TempSrc: Oral  SpO2: 97%  Weight: 184 lb 6.4 oz (83.6 kg)     Physical Exam  General Appearance:    Alert, cooperative, no distress  HENT:   bilateral TM normal without fluid or infection, neck without nodes, throat normal without erythema or exudate, frontal and maxillary sinus tender and nasal mucosa congested  Eyes:    PERRL, conjunctiva/corneas clear, EOM's intact       Lungs:     Clear to auscultation bilaterally, respirations unlabored  Heart:    Regular rate and rhythm  Neurologic:   Awake, alert, oriented x 3. No apparent focal neurological           defect.  Assessment & Plan:     1. Acute sinusitis, recurrence not specified, unspecified location  - doxycycline (VIBRA-TABS) 100 MG tablet; Take 1 tablet (100 mg total) by mouth 2 (two) times daily for 7 days.  Dispense: 14 tablet; Refill: 0 - predniSONE (DELTASONE) 10 MG tablet; 6 tablets for 1 day, then 5 for 1 day, then 4 for 1 day, then 3 for 1 day, then 2 for 1 day then 1 for 1 day.  Dispense: 21 tablet; Refill: 0  2. Allergic rhinitis due to other allergic trigger, unspecified seasonality  - predniSONE (DELTASONE) 10 MG tablet; 6 tablets for 1 day, then 5 for 1 day, then 4 for 1  day, then 3 for 1 day, then 2 for 1 day then 1 for 1 day.  Dispense: 21 tablet; Refill: 0 - montelukast (SINGULAIR) 10 MG tablet; Take 1 tablet (10 mg total) by mouth at bedtime. (start after finishing prednisone)  Dispense: 30 tablet; Refill: 1  Patient Instructions   Start using fluticasone nasal spray every day, and continue until winter starts   Start taking prescription of montelukast(Singulair) the day after finishing prednisone.         Mila Merry, MD  [facility]

## 2018-04-03 NOTE — Patient Instructions (Signed)
   Start using fluticasone nasal spray every day, and continue until winter starts   Start taking prescription of montelukast(Singulair) the day after finishing prednisone.

## 2018-06-25 ENCOUNTER — Encounter: Payer: Self-pay | Admitting: Family Medicine

## 2018-06-25 ENCOUNTER — Ambulatory Visit (INDEPENDENT_AMBULATORY_CARE_PROVIDER_SITE_OTHER): Payer: 59 | Admitting: Family Medicine

## 2018-06-25 VITALS — BP 126/78 | HR 73 | Temp 99.3°F | Resp 18 | Wt 183.0 lb

## 2018-06-25 DIAGNOSIS — J029 Acute pharyngitis, unspecified: Secondary | ICD-10-CM | POA: Diagnosis not present

## 2018-06-25 DIAGNOSIS — J0110 Acute frontal sinusitis, unspecified: Secondary | ICD-10-CM | POA: Diagnosis not present

## 2018-06-25 LAB — POCT RAPID STREP A (OFFICE): Rapid Strep A Screen: NEGATIVE

## 2018-06-25 MED ORDER — DOXYCYCLINE HYCLATE 100 MG PO TABS: 100.0000 mg | ORAL_TABLET | Freq: Two times a day (BID) | ORAL | 0 refills | Status: AC

## 2018-06-25 NOTE — Patient Instructions (Signed)
.   Please bring all of your medications to every appointment so we can make sure that our medication list is the same as yours.   

## 2018-06-25 NOTE — Progress Notes (Signed)
Patient: Denise Stephens Female    DOB: 1970-09-24   48 y.o.   MRN: [id number] Visit Date: 06/25/2018  Today's Provider: Mila Merry, MD   Chief Complaint  Patient presents with  . Sore Throat   Subjective:     Sore Throat   This is a new problem. Episode onset: 4 days ago. The problem has been gradually worsening. Neither side of throat is experiencing more pain than the other. Maximum temperature: low grade 99.4 today. Associated symptoms include congestion (nasal congestion and sinus congestion), coughing (dry), ear pain (right ear), headaches, a hoarse voice and trouble swallowing. Pertinent negatives include no abdominal pain, ear discharge, shortness of breath or vomiting. She has tried cool liquids for the symptoms. The treatment provided mild relief.    Allergies  Allergen Reactions  . Sulfa Antibiotics      Current Outpatient Medications:  .  montelukast (SINGULAIR) 10 MG tablet, Take 1 tablet (10 mg total) by mouth at bedtime. (start after finishing prednisone) (Patient not taking: Reported on 06/25/2018), Disp: 30 tablet, Rfl: 1  Review of Systems  Constitutional: Positive for appetite change and chills. Negative for fatigue and fever.  HENT: Positive for congestion (nasal congestion and sinus congestion), ear pain (right ear), hoarse voice, nosebleeds, postnasal drip, sinus pressure, sore throat and trouble swallowing. Negative for ear discharge and sneezing.   Respiratory: Positive for cough (dry). Negative for chest tightness and shortness of breath.   Cardiovascular: Negative for chest pain and palpitations.  Gastrointestinal: Negative for abdominal pain, nausea and vomiting.  Neurological: Positive for headaches. Negative for dizziness and weakness.    Social History   Tobacco Use  . Smoking status: Never Smoker  . Smokeless tobacco: Never Used  Substance Use Topics  . Alcohol use: No    Alcohol/week: 0.0 standard drinks        Objective:   BP 126/78 (BP Location: Left Arm, Patient Position: Sitting, Cuff Size: Large)   Pulse 73   Temp 99.3 F (37.4 C) (Oral)   Resp 18   Wt 183 lb (83 kg)   LMP 05/29/2018   SpO2 99% Comment: room air  BMI 34.58 kg/m  Vitals:   06/25/18 1443  BP: 126/78  Pulse: 73  Resp: 18  Temp: 99.3 F (37.4 C)  TempSrc: Oral  SpO2: 99%  Weight: 183 lb (83 kg)     Physical Exam  General Appearance:    Alert, cooperative, no distress  HENT:   bilateral TM normal without fluid or infection, throat normal without erythema or exudate, frontal sinuses tender and nasal mucosa pale and congested  Eyes:    PERRL, conjunctiva/corneas clear, EOM's intact       Lungs:     Clear to auscultation bilaterally, respirations unlabored  Heart:    Regular rate and rhythm  Neurologic:   Awake, alert, oriented x 3. No apparent focal neurological           defect.       Results for orders placed or performed in visit on 06/25/18  POCT rapid strep A  Result Value Ref Range   Rapid Strep A Screen Negative Negative       Assessment & Plan    1. Pharyngitis, unspecified etiology  - POCT rapid strep A  2. Acute frontal sinusitis, recurrence not specified  - doxycycline (VIBRA-TABS) 100 MG tablet; Take 1 tablet (100 mg total) by mouth 2 (two) times daily for 7 days.  Dispense:  14 tablet; Refill: 0     Mila Merry, MD  [facility]

## 2018-07-04 ENCOUNTER — Telehealth: Payer: Self-pay | Admitting: Family Medicine

## 2018-07-04 MED ORDER — FLUCONAZOLE 150 MG PO TABS: 150.0000 mg | ORAL_TABLET | Freq: Once | ORAL | 0 refills | Status: AC

## 2018-07-04 NOTE — Telephone Encounter (Signed)
Pt has been taking doxycycline.  Pt now has a yeast infection. She's asking if something can be called in for the infection to:   [id number] - [place], [place] - [address] AT [facility] [address] [place] [phone] (Phone) [phone] (Fax)   Thanks, [employer]

## 2018-07-04 NOTE — Telephone Encounter (Signed)
Prescription diflucan sent to [facility].

## 2019-03-18 ENCOUNTER — Encounter: Payer: Self-pay | Admitting: Obstetrics and Gynecology

## 2019-03-18 ENCOUNTER — Ambulatory Visit (INDEPENDENT_AMBULATORY_CARE_PROVIDER_SITE_OTHER): Payer: 59 | Admitting: Obstetrics and Gynecology

## 2019-03-18 ENCOUNTER — Other Ambulatory Visit: Payer: Self-pay

## 2019-03-18 ENCOUNTER — Other Ambulatory Visit (HOSPITAL_COMMUNITY)
Admission: RE | Admit: 2019-03-18 | Discharge: 2019-03-18 | Disposition: A | Discharge status: Home / Self Care | Payer: 59 | Source: Ambulatory Visit | Attending: Obstetrics and Gynecology | Admitting: Obstetrics and Gynecology

## 2019-03-18 VITALS — BP 120/80 | HR 73 | Ht 61.0 in | Wt 185.0 lb

## 2019-03-18 DIAGNOSIS — G43001 Migraine without aura, not intractable, with status migrainosus: Secondary | ICD-10-CM

## 2019-03-18 DIAGNOSIS — D62 Acute posthemorrhagic anemia: Secondary | ICD-10-CM

## 2019-03-18 DIAGNOSIS — Z124 Encounter for screening for malignant neoplasm of cervix: Secondary | ICD-10-CM

## 2019-03-18 DIAGNOSIS — F5101 Primary insomnia: Secondary | ICD-10-CM | POA: Diagnosis not present

## 2019-03-18 DIAGNOSIS — N924 Excessive bleeding in the premenopausal period: Secondary | ICD-10-CM

## 2019-03-18 DIAGNOSIS — Z1231 Encounter for screening mammogram for malignant neoplasm of breast: Secondary | ICD-10-CM

## 2019-03-18 DIAGNOSIS — B372 Candidiasis of skin and nail: Secondary | ICD-10-CM

## 2019-03-18 DIAGNOSIS — N939 Abnormal uterine and vaginal bleeding, unspecified: Secondary | ICD-10-CM

## 2019-03-18 MED ORDER — ALPRAZOLAM 0.5 MG PO TABS: 0.5000 mg | ORAL_TABLET | Freq: Every evening | ORAL | 5 refills | Status: DC | PRN

## 2019-03-18 MED ORDER — NYSTATIN 100000 UNIT/GM EX CREA: 1.0000 "application " | TOPICAL_CREAM | Freq: Two times a day (BID) | CUTANEOUS | 3 refills | Status: DC

## 2019-03-18 NOTE — Patient Instructions (Signed)
[ ]   mammogram [ ]  neurology [ ]  pelvic US

## 2019-03-18 NOTE — Progress Notes (Signed)
Patient ID: Denise Stephens, female   DOB: September 25, 1970, 48 y.o.   MRN: [id number]  Reason for Consult: Menopause (Having spotting after periods, heavy periods and clots, headaches, hot flashes, night sweats, having trouble sleeping and no sex drive)   Referred by No ref. provider found  Subjective:     HPI:  Denise Stephens is a 48 y.o. female She presents today for discussion of menorrhagia. She reports that for most of her life she has had monthly regular periods. About 3-4 months ago her period became irregular. She had more than 7 days of bleeding that occurred every 2 weeks.  She was having 3-4 days of heavy bleeding with passage of large palm size clots, gushing of blood and uterine cramps. She has had some dizziness and light headedness.   She reports as well that she has had migraines for 2 years. She used to have 1-2 migraines a month. She reports recently that increased to 4-5 times a month. She reports seeing a starburst with the migraines. She has had a recent normal eye exam. She can feel when the migraines are coming on.   Gynecological History Menarche: 14 Menopause: n/a LMP: 03/01/2019 Describes periods as heavy Last pap smear: 2014 Last Mammogram: unknown  Obstetrical History [id number]   Past Medical History:  Diagnosis Date  . Lobar pneumonia ([place]) 05/04/2015   Family History  Problem Relation Age of Onset  . Heart disease Father   . Bone cancer Father 23  . Lung cancer Father 24  . COPD Father   . Thyroid disease Sister   . Colon cancer Paternal Grandfather 34   Past Surgical History:  Procedure Laterality Date  . CESAREAN SECTION  200, 2002, 2009   x3  . TUBAL LIGATION      Short Social History:  Social History   Tobacco Use  . Smoking status: Never Smoker  . Smokeless tobacco: Never Used  Substance Use Topics  . Alcohol use: No    Alcohol/week: 0.0 standard drinks    Allergies  Allergen Reactions  . Sulfa  Antibiotics     Current Outpatient Medications  Medication Sig Dispense Refill  . ALPRAZolam (XANAX) 0.5 MG tablet Take 1 tablet (0.5 mg total) by mouth at bedtime as needed for sleep. 30 tablet 5   No current facility-administered medications for this visit.     Review of Systems  Constitutional: Negative for chills, fatigue, fever and unexpected weight change.       + hot flashes + hot/cold Intolerance  HENT: Negative for trouble swallowing.  Eyes: Negative for loss of vision.  Respiratory: Negative for cough, shortness of breath and wheezing.  Cardiovascular: Negative for chest pain, leg swelling, palpitations and syncope.  GI: Negative for abdominal pain, blood in stool, diarrhea, nausea and vomiting.  GU: Negative for difficulty urinating, dysuria, frequency and hematuria.  Musculoskeletal: Negative for back pain, leg pain and joint pain.  Skin: Negative for rash.  Neurological: Positive for headaches. Negative for dizziness, light-headedness, numbness and seizures.  Psychiatric: Negative for behavioral problem, confusion, depressed mood and sleep disturbance.        Objective:  Objective   Vitals:   03/18/19 1334  BP: 120/80  Pulse: 73  Weight: 185 lb (83.9 kg)  Height: 5\' 1"  (1.549 m)   Body mass index is 34.96 kg/m.  Physical Exam Vitals signs and nursing note reviewed.  Constitutional:      Appearance: She is well-developed.  HENT:  Head: Normocephalic and atraumatic.  Eyes:     Pupils: Pupils are equal, round, and reactive to light.  Cardiovascular:     Rate and Rhythm: Normal rate and regular rhythm.  Pulmonary:     Effort: Pulmonary effort is normal. No respiratory distress.  Genitourinary:    Comments: External: Normal appearing vulva. No lesions noted.  Speculum examination: Normal appearing cervix. No blood in the vaginal vault. no discharge.   Bimanual examination: Uterus midline, non-tender, normal in size, shape and contour.  No CMT. No  adnexal masses. No adnexal tenderness. Pelvis not fixed.    Skin:    General: Skin is warm and dry.  Neurological:     Mental Status: She is alert and oriented to person, place, and time.  Psychiatric:        Behavior: Behavior normal.        Thought Content: Thought content normal.        Judgment: Judgment normal.        Assessment/Plan:     48 yo with irregular uterine bleeding Will have patient follow up for [place] and uterine biopsy. Briefly reviewed possible management options with the patient. Will check CBC and TSH Migraines: Will consult with neurology for further care  More than 30 minutes were spent face to face with the patient in the room with more than 50% of the time spent providing counseling and discussing the plan of management. We discussed.   Adelene Idler MD [facility], [facility] 03/18/2019 3:02 PM

## 2019-03-19 LAB — TSH+FREE T4
Free T4: 1.07 ng/dL (ref 0.82–1.77)
TSH: 1.18 u[IU]/mL (ref 0.450–4.500)

## 2019-03-19 LAB — CBC
Hematocrit: 38.6 % (ref 34.0–46.6)
Hemoglobin: 11.8 g/dL (ref 11.1–15.9)
MCH: 24.1 pg — ABNORMAL LOW (ref 26.6–33.0)
MCHC: 30.6 g/dL — ABNORMAL LOW (ref 31.5–35.7)
MCV: 79 fL (ref 79–97)
Platelets: 279 10*3/uL (ref 150–450)
RBC: 4.9 x10E6/uL (ref 3.77–5.28)
RDW: 14.1 % (ref 11.7–15.4)
WBC: 9.1 10*3/uL (ref 3.4–10.8)

## 2019-03-23 LAB — CYTOLOGY - PAP
Diagnosis: NEGATIVE
High risk HPV: NEGATIVE

## 2019-03-25 ENCOUNTER — Ambulatory Visit (INDEPENDENT_AMBULATORY_CARE_PROVIDER_SITE_OTHER): Payer: 59

## 2019-03-25 ENCOUNTER — Encounter: Payer: Self-pay | Admitting: Obstetrics and Gynecology

## 2019-03-25 ENCOUNTER — Other Ambulatory Visit: Payer: Self-pay

## 2019-03-25 ENCOUNTER — Ambulatory Visit (INDEPENDENT_AMBULATORY_CARE_PROVIDER_SITE_OTHER): Payer: 59 | Admitting: Obstetrics and Gynecology

## 2019-03-25 VITALS — BP 128/82 | HR 80 | Ht 61.0 in | Wt 184.0 lb

## 2019-03-25 DIAGNOSIS — N8302 Follicular cyst of left ovary: Secondary | ICD-10-CM

## 2019-03-25 DIAGNOSIS — N920 Excessive and frequent menstruation with regular cycle: Secondary | ICD-10-CM

## 2019-03-25 DIAGNOSIS — N924 Excessive bleeding in the premenopausal period: Secondary | ICD-10-CM

## 2019-03-25 MED ORDER — NAPROXEN 500 MG PO TABS: 500.0000 mg | ORAL_TABLET | Freq: Two times a day (BID) | ORAL | 4 refills | Status: DC

## 2019-03-25 MED ORDER — TRANEXAMIC ACID 650 MG PO TABS: 1300.0000 mg | ORAL_TABLET | Freq: Three times a day (TID) | ORAL | 11 refills | Status: DC

## 2019-03-25 NOTE — Progress Notes (Signed)
Patient ID: Denise Stephens, female   DOB: [birth year], 48 y.o.   MRN: [id number]  Reason for Consult: Follow-up (U/S follow up )   Referred by Gilman Schmidt, Christanna R, *  Subjective:     HPI:  Denise Stephens is a 48 y.o. female. She is following up today for a pelvic US and further discussion of menorrhagia management.   Past Medical History:  Diagnosis Date  . Lobar pneumonia ([place]) 05/04/2015   Family History  Problem Relation Age of Onset  . Heart disease Father   . Bone cancer Father 33  . Lung cancer Father 30  . COPD Father   . Thyroid disease Sister   . Colon cancer Paternal Grandfather 72   Past Surgical History:  Procedure Laterality Date  . CESAREAN SECTION  200, 2002, 2009   x3  . TUBAL LIGATION      Short Social History:  Social History   Tobacco Use  . Smoking status: Never Smoker  . Smokeless tobacco: Never Used  Substance Use Topics  . Alcohol use: No    Alcohol/week: 0.0 standard drinks    Allergies  Allergen Reactions  . Sulfa Antibiotics     Current Outpatient Medications  Medication Sig Dispense Refill  . nystatin cream (MYCOSTATIN) Apply 1 application topically 2 (two) times daily. 30 g 3  . ALPRAZolam (XANAX) 0.5 MG tablet Take 1 tablet (0.5 mg total) by mouth at bedtime as needed for sleep. (Patient not taking: Reported on 03/25/2019) 30 tablet 5  . naproxen (NAPROSYN) 500 MG tablet Take 1 tablet (500 mg total) by mouth 2 (two) times daily with a meal. As needed for pain 60 tablet 4  . tranexamic acid (LYSTEDA) 650 MG TABS tablet Take 2 tablets (1,300 mg total) by mouth 3 (three) times daily. Take during menses for a maximum of five days 30 tablet 11   No current facility-administered medications for this visit.     REVIEW OF SYSTEMS      Objective:  Objective   Vitals:   03/25/19 1626  BP: 128/82  Pulse: 80  Weight: 184 lb (83.5 kg)  Height: 5\' 1"  (1.549 m)   Body mass index is 34.77 kg/m.   Physical Exam Vitals signs and nursing note reviewed.  Constitutional:      Appearance: She is well-developed.  HENT:     Head: Normocephalic and atraumatic.  Eyes:     Pupils: Pupils are equal, round, and reactive to light.  Cardiovascular:     Rate and Rhythm: Normal rate and regular rhythm.  Pulmonary:     Effort: Pulmonary effort is normal. No respiratory distress.  Skin:    General: Skin is warm and dry.  Neurological:     Mental Status: She is alert and oriented to person, place, and time.  Psychiatric:        Behavior: Behavior normal.        Thought Content: Thought content normal.        Judgment: Judgment normal.          Assessment/Plan:     48 yo with menorrhagia with regular periods.  Reviewed options for menorrhagia management in detail with the patient. Methods discussed included hormonal contraception, IUD, lysteda, and endometrial ablation.  She opted for Lysteda and naproxen. Risks of clot formation, venous thromboembolism and stroke associated with this medication. Patient has not history of DVT, PE, STROKE or CVD.   Follow up in 3 months.  More than 20 minutes  were spent face to face with the patient in the room with more than 50% of the time spent providing counseling and discussing the plan of management.   Adelene Idler MD [facility], [facility] 03/25/2019 5:10 PM

## 2019-03-25 NOTE — Patient Instructions (Addendum)
Tranexamic acid oral tablets What is this medicine? TRANEXAMIC ACID (TRAN ex AM ik AS id) slows down or stops blood clots from being broken down. This medicine is used to treat heavy monthly menstrual bleeding. This medicine may be used for other purposes; ask your health care provider or pharmacist if you have questions. COMMON BRAND NAME(S): Cyklokapron, Lysteda What should I tell my health care provider before I take this medicine? They need to know if you have any of these conditions:  bleeding in the brain  blood clotting problems  kidney disease  vision problems  an unusual allergic reaction to tranexamic acid, other medicines, foods, dyes, or preservatives  pregnant or trying to get pregnant  breast-feeding How should I use this medicine? Take this medicine by mouth with a glass of water. Follow the directions on the prescription label. Do not cut, crush, or chew this medicine. You can take it with or without food. If it upsets your stomach, take it with food. Take your medicine at regular intervals. Do not take it more often than directed. Do not stop taking except on your doctor's advice. Do not take this medicine until your period has started. Do not take it for more than 5 days in a row. Do not take this medicine when you do not have your period. Talk to your pediatrician regarding the use of this medicine in children. While this drug may be prescribed for female children as young as 12 years of age for selected conditions, precautions do apply. Overdosage: If you think you have taken too much of this medicine contact a poison control center or emergency room at once. NOTE: This medicine is only for you. Do not share this medicine with others. What if I miss a dose? If you miss a dose, take it when you remember, and then take your next dose at least 6 hours later. Do not take more than 2 tablets at a time to make up for missed doses. What may interact with this medicine? Do  not take this medicine with any of the following medications:  estrogens  birth control pills, patches, injections, rings or other devices that contain both an estrogen and a progestin This medicine may also interact with the following medications:  certain medicines used to help your blood clot  tretinoin (taken by mouth) This list may not describe all possible interactions. Give your health care provider a list of all the medicines, herbs, non-prescription drugs, or dietary supplements you use. Also tell them if you smoke, drink alcohol, or use illegal drugs. Some items may interact with your medicine. What should I watch for while using this medicine? Tell your doctor or healthcare professional if your symptoms do not start to get better or if they get worse. Tell your doctor or healthcare professional if you notice any eye problems while taking this medicine. Your doctor will refer you to an eye doctor who will examine your eyes. What side effects may I notice from receiving this medicine? Side effects that you should report to your doctor or health care professional as soon as possible:  allergic reactions like skin rash, itching or hives, swelling of the face, lips, or tongue  breathing difficulties  changes in vision  sudden or severe pain in the chest, legs, head, or groin  unusually weak or tired Side effects that usually do not require medical attention (report to your doctor or health care professional if they continue or are bothersome):  back pain    headache  muscle or joint aches  sinus and nasal problems  stomach pain  tiredness This list may not describe all possible side effects. Call your doctor for medical advice about side effects. You may report side effects to FDA at 1-800-FDA-1088. Where should I keep my medicine? Keep out of the reach of children. Store at room temperature between 15 and 30 degrees C (59 and 86 degrees F). Throw away any unused  medicine after the expiration date. NOTE: This sheet is a summary. It may not cover all possible information. If you have questions about this medicine, talk to your doctor, pharmacist, or health care provider.  2020 Elsevier/Gold Standard (2015-07-07 09:12:15)      Iron-Rich Diet  Iron is a mineral that helps your body to produce hemoglobin. Hemoglobin is a protein in red blood cells that carries oxygen to your body's tissues. Eating too little iron may cause you to feel weak and tired, and it can increase your risk of infection. Iron is naturally found in many foods, and many foods have iron added to them (iron-fortified foods). You may need to follow an iron-rich diet if you do not have enough iron in your body due to certain medical conditions. The amount of iron that you need each day depends on your age, your sex, and any medical conditions you have. Follow instructions from your health care provider or a diet and nutrition specialist (dietitian) about how much iron you should eat each day. What are tips for following this plan? Reading food labels  Check food labels to see how many milligrams (mg) of iron are in each serving. Cooking  Cook foods in pots and pans that are made from iron.  Take these steps to make it easier for your body to absorb iron from certain foods: ? Soak beans overnight before cooking. ? Soak whole grains overnight and drain them before using. ? Ferment flours before baking, such as by using yeast in bread dough. Meal planning  When you eat foods that contain iron, you should eat them with foods that are high in vitamin C. These include oranges, peppers, tomatoes, potatoes, and mango. Vitamin C helps your body to absorb iron. General information  Take iron supplements only as told by your health care provider. An overdose of iron can be life-threatening. If you were prescribed iron supplements, take them with orange juice or a vitamin C supplement.  When  you eat iron-fortified foods or take an iron supplement, you should also eat foods that naturally contain iron, such as meat, poultry, and fish. Eating naturally iron-rich foods helps your body to absorb the iron that is added to other foods or contained in a supplement.  Certain foods and drinks prevent your body from absorbing iron properly. Avoid eating these foods in the same meal as iron-rich foods or with iron supplements. These foods include: ? Coffee, black tea, and red wine. ? Milk, dairy products, and foods that are high in calcium. ? Beans and soybeans. ? Whole grains. What foods should I eat? Fruits Prunes. Raisins. Eat fruits high in vitamin C, such as oranges, grapefruits, and strawberries, alongside iron-rich foods. Vegetables Spinach (cooked). Green peas. Broccoli. Fermented vegetables. Eat vegetables high in vitamin C, such as leafy greens, potatoes, bell peppers, and tomatoes, alongside iron-rich foods. Grains Iron-fortified breakfast cereal. Iron-fortified whole-wheat bread. Enriched rice. Sprouted grains. Meats and other proteins Beef liver. Oysters. Beef. Shrimp. [place]. Chicken. [place]. Sardines. Chickpeas. Nuts. Tofu. Pumpkin seeds. Beverages Tomato juice. Fresh  orange juice. Prune juice. Hibiscus tea. Fortified instant breakfast shakes. Sweets and desserts Blackstrap molasses. Seasonings and condiments Tahini. Fermented soy sauce. Other foods Wheat germ. The items listed above may not be a complete list of recommended foods and beverages. Contact a dietitian for more information. What foods should I avoid? Grains Whole grains. Bran cereal. Bran flour. Oats. Meats and other proteins Soybeans. Products made from soy protein. Black beans. Lentils. Mung beans. Split peas. Dairy Milk. Cream. Cheese. Yogurt. Cottage cheese. Beverages Coffee. Black tea. Red wine. Sweets and desserts Cocoa. Chocolate. Ice cream. Other foods Basil. Oregano. Large amounts of  parsley. The items listed above may not be a complete list of foods and beverages to avoid. Contact a dietitian for more information. Summary  Iron is a mineral that helps your body to produce hemoglobin. Hemoglobin is a protein in red blood cells that carries oxygen to your body's tissues.  Iron is naturally found in many foods, and many foods have iron added to them (iron-fortified foods).  When you eat foods that contain iron, you should eat them with foods that are high in vitamin C. Vitamin C helps your body to absorb iron.  Certain foods and drinks prevent your body from absorbing iron properly, such as whole grains and dairy products. You should avoid eating these foods in the same meal as iron-rich foods or with iron supplements. This information is not intended to replace advice given to you by your health care provider. Make sure you discuss any questions you have with your health care provider. Document Released: 01/16/2005 Document Revised: 05/17/2017 Document Reviewed: 04/30/2017 Elsevier Patient Education  2020 [employer].       Institute of Medicine Recommended Dietary Allowances for Calcium and Vitamin D  Age (yr) Calcium Recommended Dietary Allowance (mg/day) Vitamin D Recommended Dietary Allowance (international units/day)  9-18 1,300 600  19-50 1,000 600  51-70 1,200 600  71 and older 1,200 800  Data from Institute of Medicine. Dietary reference intakes: calcium, vitamin D. [place], [place]: [employer]; 2011.

## 2019-03-31 NOTE — Progress Notes (Signed)
Virtual Visit via Video Note The purpose of this virtual visit is to provide medical care while limiting exposure to the novel coronavirus.    Consent was obtained for video visit:  Yes.   Answered questions that patient had about telehealth interaction:  Yes.   I discussed the limitations, risks, security and privacy concerns of performing an evaluation and management service by telemedicine. I also discussed with the patient that there may be a patient responsible charge related to this service. The patient expressed understanding and agreed to proceed.  Pt location: Home Physician Location: Home Name of referring provider:  Natale Milch, * I connected with Denise Stephens at patients initiation/request on 04/02/2019 at  9:10 AM EDT by video enabled telemedicine application and verified that I am speaking with the correct person using two identifiers. Pt MRN:  [id number] Pt DOB:  09-29-1970 Video Participants:  Denise Stephens   History of Present Illness:  Denise Stephens is a 48 year old female who presents for migraines.  History supplemented by referring provider note.  Onset:  Infrequent since childhood.  Since about 48 years old, worse over past year. Location:  Back of head and radiate up to forehead bilaterally Quality:  Pressure behind yes, otherwise throbbing Intensity:  6-8/10.  She denies new headache, thunderclap headache Aura: Occasional kaleidoscopic vision.  Sometimes sees floaters or gray spots with blurred peripheral vision (occurs with headache) Premonitory Phase:  no Postdrome:  no Associated symptoms:  Photophobia, phonophobia  She denies nausea, vomiting and associated unilateral numbness or weakness. Duration:  1 day (longest one lasted 4 days on 03/01/2019.  This one involved only left side of head and had blurred vision in only left eye.  Currently going through pre-menopause) Frequency:  8 to 10 a month Frequency of  abortive medication: BC powder 6 times a month Triggers:  Hormonal (going through pre-menopause, menorrhagia) Relieving factors:  BC powder Activity:  aggravates  Current NSAIDS:  none Current analgesics:  BC powder Current triptans:  none Current ergotamine:  none Current anti-emetic:  none Current muscle relaxants:  none Current anti-anxiolytic:  none Current sleep aide:  none Current Antihypertensive medications:  none Current Antidepressant medications:  none Current Anticonvulsant medications:  none Current anti-CGRP:  none Current Vitamins/Herbal/Supplements:  none Current Antihistamines/Decongestants:  none Other therapy:  none Hormone/birth control:  none Other medications:  none  Past NSAIDS:  Advil Migraine, ibuprofen, naproxen Past analgesics:  Excedrin Migraine, Tylenol Past abortive triptans:  none Past abortive ergotamine:  none Past muscle relaxants:  none Past anti-emetic:  none Past antihypertensive medications:  none Past antidepressant medications:  none Past anticonvulsant medications:  none Past anti-CGRP:  none Other past therapies:  none  Caffeine:  No coffee.  Dr. Reino Kent 2 glasses a day Alcohol:  no Smoker:  no Diet:  32 oz water daily.  Does not skip meals Exercise:  no Depression:  no; Anxiety:  no Other pain:  no Sleep hygiene:  Insomnia (prescribed Xanax but hasn't tried it yet) Family history of headache:  Mother (migraines).  No family history of aneurysms.    03/18/2019 LABS:  CBC with WBC 9.1, HGB 11.8, HCT 38.6, PLT 270; TSH 1.180, free T4 1.07.   Past Medical History: Past Medical History:  Diagnosis Date  . Lobar pneumonia (HCC) 05/04/2015    Medications: Outpatient Encounter Medications as of 04/02/2019  Medication Sig  . ALPRAZolam (XANAX) 0.5 MG tablet Take 1 tablet (0.5 mg total) by mouth at  bedtime as needed for sleep. (Patient not taking: Reported on 03/25/2019)  . naproxen (NAPROSYN) 500 MG tablet Take 1 tablet (500 mg  total) by mouth 2 (two) times daily with a meal. As needed for pain  . nystatin cream (MYCOSTATIN) Apply 1 application topically 2 (two) times daily.  . tranexamic acid (LYSTEDA) 650 MG TABS tablet Take 2 tablets (1,300 mg total) by mouth 3 (three) times daily. Take during menses for a maximum of five days   No facility-administered encounter medications on file as of 04/02/2019.     Allergies: Allergies  Allergen Reactions  . Sulfa Antibiotics     Family History: Family History  Problem Relation Age of Onset  . Heart disease Father   . Bone cancer Father [phone]  . Lung cancer Father [phone]  . COPD Father   . Thyroid disease Sister   . Colon cancer Paternal Grandfather [phone]    Social History: Social History   Socioeconomic History  . Marital status: Married    Spouse name: Not on file  . Number of children: 3  . Years of education: Not on file  . Highest education level: Not on file  Occupational History  . Not on file  Social Needs  . Financial resource strain: Not on file  . Food insecurity    Worry: Not on file    Inability: Not on file  . Transportation needs    Medical: Not on file    Non-medical: Not on file  Tobacco Use  . Smoking status: Never Smoker  . Smokeless tobacco: Never Used  Substance and Sexual Activity  . Alcohol use: No    Alcohol/week: 0.0 standard drinks  . Drug use: No  . Sexual activity: Yes    Birth control/protection: Surgical  Lifestyle  . Physical activity    Days per week: Not on file    Minutes per session: Not on file  . Stress: Not on file  Relationships  . Social Musicianconnections    Talks on phone: Not on file    Gets together: Not on file    Attends religious service: Not on file    Active member of club or organization: Not on file    Attends meetings of clubs or organizations: Not on file    Relationship status: Not on file  . Intimate partner violence    Fear of current or ex partner: Not on file    Emotionally abused: Not on  file    Physically abused: Not on file    Forced sexual activity: Not on file  Other Topics Concern  . Not on file  Social History Narrative  . Not on file    Observations/Objective:   Height 5\' 1"  (1.549 m), weight 184 lb (83.5 kg). No acute distress.  Alert and oriented.  Speech fluent and not dysarthric.  Language intact.  Eyes orthophoric on primary gaze.  Face symmetric.  Assessment and Plan:   Migraine with aura, without status migrainosus, not intractable  1.  For preventative management, topiramate 25mg  at bedtime.  We can increase to 50mg  at bedtime in 4 weeks if needed 2.  For abortive therapy, Maxalt 10mg .  Stop BC. 3.  Limit use of pain relievers to no more than 2 days out of week to prevent risk of rebound or medication-overuse headache. 4.  Keep headache diary 5.  Exercise, hydration, caffeine cessation, sleep hygiene, monitor for and avoid triggers 6.  Consider:  magnesium citrate 400mg  daily, riboflavin 400mg  daily,  and coenzyme Q10 100mg  three times daily 7. Always keep in mind that currently taking a hormone or birth control may be a possible trigger or aggravating factor for migraine. 8. Follow up 4 months   Follow Up Instructions:    -I discussed the assessment and treatment plan with the patient. The patient was provided an opportunity to ask questions and all were answered. The patient agreed with the plan and demonstrated an understanding of the instructions.   The patient was advised to call back or seek an in-person evaluation if the symptoms worsen or if the condition fails to improve as anticipated.    Dudley Major, DO

## 2019-04-01 NOTE — Progress Notes (Signed)
WNL

## 2019-04-02 ENCOUNTER — Other Ambulatory Visit: Payer: Self-pay

## 2019-04-02 ENCOUNTER — Telehealth: Payer: Self-pay | Admitting: Obstetrics and Gynecology

## 2019-04-02 ENCOUNTER — Encounter: Payer: Self-pay | Admitting: Neurology

## 2019-04-02 ENCOUNTER — Telehealth (INDEPENDENT_AMBULATORY_CARE_PROVIDER_SITE_OTHER): Payer: 59 | Admitting: Neurology

## 2019-04-02 VITALS — Ht 61.0 in | Wt 184.0 lb

## 2019-04-02 DIAGNOSIS — G43109 Migraine with aura, not intractable, without status migrainosus: Secondary | ICD-10-CM

## 2019-04-02 MED ORDER — TOPIRAMATE 25 MG PO TABS: 25.0000 mg | ORAL_TABLET | Freq: Every day | ORAL | 3 refills | Status: DC

## 2019-04-02 MED ORDER — RIZATRIPTAN BENZOATE 10 MG PO TABS: ORAL_TABLET | ORAL | 3 refills | Status: DC

## 2019-04-02 NOTE — Telephone Encounter (Signed)
Patient is schedule for Monday, 04/13/19. Patient is requesting additional counsel. Please call patient. Patient is available after 10 am today

## 2019-04-02 NOTE — Patient Instructions (Signed)
1.  Start topiramate 25mg  at bedtime.  If headaches not improved in 4 weeks, contact me and we can increase dose. 2.  stop BC powder.  At earliest onset of migraine, take rizatriptan 10mg .  May repeat dose after 2 hours if needed.  Maximum 2 tablets in 24 hours. 3.  Limit use of pain relievers to no more than 2 days out of week to prevent risk of rebound or medication-overuse headache. 4.  Keep headache diary 5.  Exercise, hydration, caffeine cessation, sleep hygiene, monitor for and avoid triggers 6.  Consider:  magnesium citrate 400mg  daily, riboflavin 400mg  daily, and coenzyme Q10 100mg  three times daily 7. Always keep in mind that currently taking a hormone or birth control may be a possible trigger or aggravating factor for migraine. 8. Follow up 4 months

## 2019-04-02 NOTE — Telephone Encounter (Signed)
Returned call and spoke with patient.

## 2019-04-02 NOTE — Telephone Encounter (Signed)
-----   Message from Homero Fellers, MD sent at 04/01/2019  7:01 PM EDT ----- Could you please call this patient and schedule her for an endometrial biopsy? Would like to do in the next 3 weeks if possible. Thank you,  Dr. Gilman Schmidt

## 2019-04-13 ENCOUNTER — Other Ambulatory Visit: Payer: Self-pay

## 2019-04-13 ENCOUNTER — Other Ambulatory Visit (HOSPITAL_COMMUNITY)
Admission: RE | Admit: 2019-04-13 | Discharge: 2019-04-13 | Disposition: A | Discharge status: Home / Self Care | Payer: 59 | Source: Ambulatory Visit | Attending: Obstetrics and Gynecology | Admitting: Obstetrics and Gynecology

## 2019-04-13 ENCOUNTER — Encounter: Payer: Self-pay | Admitting: Obstetrics and Gynecology

## 2019-04-13 ENCOUNTER — Ambulatory Visit (INDEPENDENT_AMBULATORY_CARE_PROVIDER_SITE_OTHER): Payer: 59 | Admitting: Obstetrics and Gynecology

## 2019-04-13 VITALS — BP 122/78 | HR 69 | Ht 61.0 in | Wt 187.0 lb

## 2019-04-13 DIAGNOSIS — N920 Excessive and frequent menstruation with regular cycle: Secondary | ICD-10-CM | POA: Diagnosis present

## 2019-04-13 DIAGNOSIS — N840 Polyp of corpus uteri: Secondary | ICD-10-CM | POA: Diagnosis not present

## 2019-04-13 DIAGNOSIS — N939 Abnormal uterine and vaginal bleeding, unspecified: Secondary | ICD-10-CM

## 2019-04-13 DIAGNOSIS — R300 Dysuria: Secondary | ICD-10-CM

## 2019-04-13 DIAGNOSIS — N924 Excessive bleeding in the premenopausal period: Secondary | ICD-10-CM | POA: Insufficient documentation

## 2019-04-13 NOTE — Patient Instructions (Signed)

## 2019-04-13 NOTE — Progress Notes (Signed)
  Endometrial Biopsy After discussion with the patient regarding her abnormal uterine bleeding I recommended that she proceed with an endometrial biopsy for further diagnosis. The risks, benefits, alternatives, and indications for an endometrial biopsy were discussed with the patient in detail. She understood the risks including infection, bleeding, cervical laceration and uterine perforation.  Verbal consent was obtained.   PROCEDURE NOTE:  Pipelle endometrial biopsy was performed using aseptic technique with iodine preparation.  The uterus was sounded to a length of 9 cm.  Adequate sampling was obtained with minimal blood loss.  The patient tolerated the procedure well.  Disposition will be pending pathology.  Urine culture sent for dysuria, UA normal in office.  Adrian Prows MD [facility] 04/13/2019 10:54 AM

## 2019-04-15 ENCOUNTER — Other Ambulatory Visit: Payer: Self-pay | Admitting: Obstetrics and Gynecology

## 2019-04-15 LAB — SURGICAL PATHOLOGY

## 2019-04-16 NOTE — Progress Notes (Signed)
Called and discussed result with patient.

## 2019-04-17 LAB — URINE CULTURE

## 2019-04-17 NOTE — Progress Notes (Signed)
WNL- released to mychart with note

## 2019-04-21 ENCOUNTER — Telehealth: Payer: Self-pay | Admitting: Obstetrics and Gynecology

## 2019-04-21 NOTE — Telephone Encounter (Signed)
-----   Message from Homero Fellers, MD sent at 04/01/2019  6:59 PM EDT ----- Could you call this patient and given her an idea about how much an endometrial biopsy would cost? Thank you,  Christanna

## 2019-04-21 NOTE — Telephone Encounter (Signed)
Discussed approximate cost w/ patient.

## 2019-04-27 ENCOUNTER — Other Ambulatory Visit: Payer: Self-pay | Admitting: Neurology

## 2019-06-24 ENCOUNTER — Ambulatory Visit (INDEPENDENT_AMBULATORY_CARE_PROVIDER_SITE_OTHER): Payer: BC Managed Care – PPO | Admitting: Obstetrics and Gynecology

## 2019-06-24 ENCOUNTER — Encounter: Payer: Self-pay | Admitting: Obstetrics and Gynecology

## 2019-06-24 DIAGNOSIS — N920 Excessive and frequent menstruation with regular cycle: Secondary | ICD-10-CM | POA: Diagnosis not present

## 2019-06-24 DIAGNOSIS — N924 Excessive bleeding in the premenopausal period: Secondary | ICD-10-CM

## 2019-06-24 DIAGNOSIS — N939 Abnormal uterine and vaginal bleeding, unspecified: Secondary | ICD-10-CM

## 2019-06-24 NOTE — Progress Notes (Signed)
Virtual Visit via Telephone Note  I connected with Denise Stephens on 06/24/19 at  9:10 AM EST by telephone and verified that I am speaking with the correct person using two identifiers.   I discussed the limitations, risks, security and privacy concerns of performing an evaluation and management service by telephone and the availability of in person appointments. I also discussed with the patient that there may be a patient responsible charge related to this service. The patient expressed understanding and agreed to proceed.  The patient was at home I spoke with the patient from my  office The names of people involved in this encounter were: [name] and Dr. Jerene Pitch.  History of Present Illness: She reports significant improvement in her bleeding pattern over the last two cycles. She had a menstrual period November 28 until December 1.  Reports 4 days of bleeding which was improved by the Lysteda.  She then had a second period December 25-29 she also reports that the Lysteda helped cut down on the passage of large clots that she was having before.  She has not had bleeding or spotting in between her periods she is overall very happy with this treatment modality and plans to continue.  She understands that she should follow-up if she has any problems with bleeding in between or heavy bleeding returns.    Observations/Objective:   Physical Exam could not be performed. Because of the COVID-19 outbreak this visit was performed over the phone and not in person.   Assessment and Plan: 49 year old with menorrhagia improved with current treatment therapy of Lysteda for 5 days during her menstrual cycle.  Continue with regimen discussed follow-up if patient has return of heavy bleeding or spotting in between periods. We will plan follow-up on an annual basis.   Follow Up Instructions: Return in 1 year for annual visit.    I discussed the assessment and treatment plan with the patient. The  patient was provided an opportunity to ask questions and all were answered. The patient agreed with the plan and demonstrated an understanding of the instructions.   The patient was advised to call back or seek an in-person evaluation if the symptoms worsen or if the condition fails to improve as anticipated.  I provided 5 minutes of non-face-to-face time during this encounter.  Adelene Idler MD [facility], [facility] 06/24/2019 9:33 AM

## 2019-06-25 ENCOUNTER — Ambulatory Visit: Payer: 59 | Admitting: Obstetrics and Gynecology

## 2019-08-05 ENCOUNTER — Encounter: Payer: Self-pay | Admitting: Neurology

## 2019-08-05 NOTE — Progress Notes (Signed)
Due to the COVID-19 crisis, this virtual check-in visit was done via telephone from my office and it was initiated and consent given by this patient and or family.   Telephone (Audio) Visit The purpose of this telephone visit is to provide medical care while limiting exposure to the novel coronavirus.    Consent was obtained for telephone visit and initiated by pt/family:  yes Answered questions that patient had about telehealth interaction:  Yes I discussed the limitations, risks, security and privacy concerns of performing an evaluation and management service by telephone. I also discussed with the patient that there may be a patient responsible charge related to this service. The patient expressed understanding and agreed to proceed.  Pt location: Home Physician Location: office Name of referring provider:  No ref. provider found I connected with .Denise Stephens at patients initiation/request on 08/06/2019 at 10:30 AM EST by telephone and verified that I am speaking with the correct person using two identifiers.  Pt MRN:  [id number] Pt DOB:  Dec 19, 1970  History of Present Illness:  Denise Stephens is a 49 year old female who follows up for migraines.  UPDATE: In October, she was started on topiramate.  She was advised to stop [facility] and try rizatriptan for abortive therapy.  Intensity:  6-8/10 Duration:  Usually within an hour (once had to repeat dose) Frequency:  10 headaches in January due to stress; so far 2 in February  Frequency of abortive medication: usually a couple of times a month Current NSAIDS:  none Current analgesics:  BC powder (rare, maybe 2 times a month) Current triptans:  rizatriptan 10mg  Current ergotamine:  none Current anti-emetic:  none Current muscle relaxants:  none Current anti-anxiolytic:  alprazolam 0.5mg   Current sleep aide:  alprazolam 0.5mg  QHS PRN Current Antihypertensive medications:  none Current Antidepressant medications:   none Current Anticonvulsant medications:  topiramate 25mg  at bedtime Current anti-CGRP:  none Current Vitamins/Herbal/Supplements:  ferrous sulfate Current Antihistamines/Decongestants:  none Other therapy:  none Hormone/birth control:  none Other medications:  tranexamic acid  Caffeine:  No coffee.  Dr. Malachi Bonds 2 glasses a day Alcohol:  no Smoker:  no Diet:  32 oz water daily.  Does not skip meals Exercise:  no Depression:  no; Anxiety:  no Other pain:  no Sleep hygiene:  Insomnia (prescribed Xanax but hasn't tried it yet)  HISTORY: Onset:  Infrequent since childhood.  Since about 49 years old, worse over past year. Location:  Back of head and radiate up to forehead bilaterally Quality:  Pressure behind yes, otherwise throbbing Initial intensity:  6-8/10.  She denies new headache, thunderclap headache Aura: Occasional kaleidoscopic vision.  Sometimes sees floaters or gray spots with blurred peripheral vision (occurs with headache) Premonitory Phase:  no Postdrome:  no Associated symptoms:  Photophobia, phonophobia  She denies nausea, vomiting and associated unilateral numbness or weakness. Initial duration:  1 day (longest one lasted 4 days on 03/01/2019.  This one involved only left side of head and had blurred vision in only left eye.  Currently going through pre-menopause) Initial frequency:  8 to 10 a month Initial frequency of abortive medication: BC powder 6 times a month Triggers:  Hormonal (going through pre-menopause, menorrhagia); emotional stress; flavored rice; certain yogurt; certain spices (Mrs. Deliah Boston); certain scents (floral perfumes, car smells) Relieving factors:  BC powder Activity:  aggravates  Past NSAIDS:  Advil Migraine, ibuprofen, naproxen Past analgesics:  Excedrin Migraine, Tylenol, BC powder Past abortive triptans:  none Past abortive ergotamine:  none Past muscle relaxants:  none Past anti-emetic:  none Past antihypertensive medications:  none Past  antidepressant medications:  none Past anticonvulsant medications:  none Past anti-CGRP:  none Other past therapies:  none   Family history of headache:  Mother (migraines).  No family history of aneurysms.    Past Medical History: Past Medical History:  Diagnosis Date  . Lobar pneumonia (HCC) 05/04/2015    Medications: Outpatient Encounter Medications as of 08/06/2019  Medication Sig  . ALPRAZolam (XANAX) 0.5 MG tablet Take 1 tablet (0.5 mg total) by mouth at bedtime as needed for sleep. (Patient not taking: Reported on 03/25/2019)  . Ferrous Sulfate (IRON) 28 MG TABS Take by mouth.  . naproxen (NAPROSYN) 500 MG tablet Take 1 tablet (500 mg total) by mouth 2 (two) times daily with a meal. As needed for pain  . nystatin cream (MYCOSTATIN) Apply 1 application topically 2 (two) times daily.  . rizatriptan (MAXALT) 10 MG tablet Take 1 tablet earliest onset of migraine.  May repeat in 2 hours if needed.  Maximum 2 tablets in 24 hours.  . topiramate (TOPAMAX) 25 MG tablet TAKE 1 TABLET BY MOUTH EVERYDAY AT BEDTIME  . tranexamic acid (LYSTEDA) 650 MG TABS tablet Take 2 tablets (1,300 mg total) by mouth 3 (three) times daily. Take during menses for a maximum of five days   No facility-administered encounter medications on file as of 08/06/2019.    Allergies: Allergies  Allergen Reactions  . Sulfa Antibiotics     Family History: Family History  Problem Relation Age of Onset  . Heart disease Father   . Bone cancer Father 56  . Lung cancer Father 61  . COPD Father   . Thyroid disease Sister   . Colon cancer Paternal Grandfather 47    Social History: Social History   Socioeconomic History  . Marital status: Married    Spouse name: Not on file  . Number of children: 3  . Years of education: 28  . Highest education level: Not on file  Occupational History  . Occupation: unemployed  Tobacco Use  . Smoking status: Never Smoker  . Smokeless tobacco: Never Used  Substance and  Sexual Activity  . Alcohol use: No    Alcohol/week: 0.0 standard drinks  . Drug use: No  . Sexual activity: Yes    Birth control/protection: Surgical  Other Topics Concern  . Not on file  Social History Narrative   Left handed   3 children   One story home   Drinks Dr. Reino Kent 2 cups daily   Social Determinants of Health   Financial Resource Strain:   . Difficulty of Paying Living Expenses: Not on file  Food Insecurity:   . Worried About Programme researcher, broadcasting/film/video in the Last Year: Not on file  . Ran Out of Food in the Last Year: Not on file  Transportation Needs:   . Lack of Transportation (Medical): Not on file  . Lack of Transportation (Non-Medical): Not on file  Physical Activity:   . Days of Exercise per Week: Not on file  . Minutes of Exercise per Session: Not on file  Stress:   . Feeling of Stress : Not on file  Social Connections:   . Frequency of Communication with Friends and Family: Not on file  . Frequency of Social Gatherings with Friends and Family: Not on file  . Attends Religious Services: Not on file  . Active Member of Clubs or Organizations: Not on file  .  Attends Banker Meetings: Not on file  . Marital Status: Not on file  Intimate Partner Violence:   . Fear of Current or Ex-Partner: Not on file  . Emotionally Abused: Not on file  . Physically Abused: Not on file  . Sexually Abused: Not on file    Observations/Objective:   There were no vitals taken for this visit. No acute distress.  Alert and oriented.  Speech fluent and not dysarthric.  Language intact.  Eyes orthophoric on primary gaze.  Face symmetric.  Assessment and Plan:   Migraine with aura, without status migrainosus, not intractable.  Improved.  Last month was a bad month due to family stressors but much better now.  1.  For preventative management, topiramate 25mg  at bedtime (refilled) 2.  For abortive therapy, rizatriptan 10mg  (refilled) 3.  Limit use of pain relievers to no  more than 2 days out of week to prevent risk of rebound or medication-overuse headache. 4.  Keep headache diary 5.  Exercise, hydration, caffeine cessation, sleep hygiene, monitor for and avoid triggers 6. Follow up 4 months  Immediate in-office follow up:  No   Follow Up Instructions:    -I discussed the assessment and treatment plan with the patient. The patient was provided an opportunity to ask questions and all were answered. The patient agreed with the plan and demonstrated an understanding of the instructions.   The patient was advised to call back or seek an in-person evaluation if the symptoms worsen or if the condition fails to improve as anticipated.  Total time spent with patient:  12 minutes   , DO

## 2019-08-06 ENCOUNTER — Telehealth (INDEPENDENT_AMBULATORY_CARE_PROVIDER_SITE_OTHER): Payer: BC Managed Care – PPO | Admitting: Neurology

## 2019-08-06 ENCOUNTER — Encounter: Payer: Self-pay | Admitting: Neurology

## 2019-08-06 ENCOUNTER — Other Ambulatory Visit: Payer: Self-pay

## 2019-08-06 DIAGNOSIS — G43109 Migraine with aura, not intractable, without status migrainosus: Secondary | ICD-10-CM | POA: Diagnosis not present

## 2019-08-06 MED ORDER — TOPIRAMATE 25 MG PO TABS: ORAL_TABLET | ORAL | 2 refills | Status: DC

## 2019-08-06 MED ORDER — RIZATRIPTAN BENZOATE 10 MG PO TABS: ORAL_TABLET | ORAL | 5 refills | Status: DC

## 2019-12-07 NOTE — Progress Notes (Signed)
NEUROLOGY FOLLOW UP OFFICE NOTE  Denise Stephens [id number]  HISTORY OF PRESENT ILLNESS: Denise Stephens is a 49 year old female who follows up for migraines.  UPDATE: Intensity:  6-8/10 Duration:  Usually within an hour Frequency:  March - 5; April - 2; May - 5; June 1 migraine, 12-18 (during period) slight headache). Frequency of abortive medication: usually a couple of times a month Current NSAIDS:none Current analgesics:BC powder (rare) Current triptans:rizatriptan 10mg  Current ergotamine:none Current anti-emetic:none Current muscle relaxants:none Current anti-anxiolytic:alprazolam 0.5mg   Current sleep aide:alprazolam 0.5mg  QHS PRN Current Antihypertensive medications:none Current Antidepressant medications:none Current Anticonvulsant medications:topiramate 25mg  at bedtime Current anti-CGRP:none Current Vitamins/Herbal/Supplements:ferrous sulfate Current Antihistamines/Decongestants:none Other therapy:none Hormone/birth control:none Other medications:tranexamic acid  Caffeine:No coffee. Dr. 2 glasses a day Alcohol:no Smoker:no Diet:32 oz water daily. Does not skip meals Exercise:no Depression:no; Anxiety:no Other pain:no Sleep hygiene:Insomnia (prescribed Xanax but hasn't tried it yet)  HISTORY: Onset:Infrequent since childhood. Since about 49 years old, worse over past year. Location:Back of head and radiate up to forehead bilaterally Quality:Pressure behind yes, otherwise throbbing Initial intensity:6-8/10.Shedenies new headache, thunderclap headache Aura:Occasional kaleidoscopic vision. Sometimes sees floaters or gray spots with blurred peripheral vision (occurs with headache) Premonitory Phase:no Postdrome:no Associated symptoms:Photophobia, phonophobia Shedenies nausea, vomiting andassociated unilateral numbness or weakness. Initial duration:1 day  (longest one lasted 4 days on 03/01/2019. This one involved only left side of head and had blurred vision in only left eye. Currently going through pre-menopause) Initial frequency:8 to 10 a month Initial frequency of abortive medication:BC powder 6 times a month Triggers:Hormonal (going through pre-menopause, menorrhagia); emotional stress; flavored rice; certain yogurt; certain spices (Mrs. 03-06-1978); certain scents (floral perfumes, car smells) Relieving factors:BC powder Activity:aggravates  Past NSAIDS:Advil Migraine, ibuprofen, naproxen Past analgesics:Excedrin Migraine, Tylenol, BC powder Past abortive triptans:none Past abortive ergotamine:none Past muscle relaxants:none Past anti-emetic:none Past antihypertensive medications:none Past antidepressant medications:none Past anticonvulsant medications:none Past anti-CGRP:none Other past therapies:none   Family history of headache:Mother (migraines). No family history of aneurysms.   PAST MEDICAL HISTORY: Past Medical History:  Diagnosis Date  . Lobar pneumonia (HCC) 05/04/2015    MEDICATIONS: Current Outpatient Medications on File Prior to Visit  Medication Sig Dispense Refill  . ALPRAZolam (XANAX) 0.5 MG tablet Take 1 tablet (0.5 mg total) by mouth at bedtime as needed for sleep. 30 tablet 5  . Ferrous Sulfate (IRON) 28 MG TABS Take by mouth.    . naproxen (NAPROSYN) 500 MG tablet Take 1 tablet (500 mg total) by mouth 2 (two) times daily with a meal. As needed for pain 60 tablet 4  . nystatin cream (MYCOSTATIN) Apply 1 application topically 2 (two) times daily. 30 g 3  . rizatriptan (MAXALT) 10 MG tablet Take 1 tablet earliest onset of migraine.  May repeat in 2 hours if needed.  Maximum 2 tablets in 24 hours. 10 tablet 5  . topiramate (TOPAMAX) 25 MG tablet TAKE 1 TABLET BY MOUTH EVERYDAY AT BEDTIME 90 tablet 2  . tranexamic acid (LYSTEDA) 650 MG TABS tablet Take 2 tablets (1,300 mg  total) by mouth 3 (three) times daily. Take during menses for a maximum of five days 30 tablet 11   No current facility-administered medications on file prior to visit.    ALLERGIES: Allergies  Allergen Reactions  . Sulfa Antibiotics     FAMILY HISTORY: Family History  Problem Relation Age of Onset  . Heart disease Father   . Bone cancer Father 74  . Lung cancer Father 35  . COPD Father   .  Thyroid disease Sister   . Colon cancer Paternal Grandfather 77   SOCIAL HISTORY: Social History   Socioeconomic History  . Marital status: Married    Spouse name: Not on file  . Number of children: 3  . Years of education: 57  . Highest education level: Not on file  Occupational History  . Occupation: unemployed  Tobacco Use  . Smoking status: Never Smoker  . Smokeless tobacco: Never Used  Vaping Use  . Vaping Use: Never used  Substance and Sexual Activity  . Alcohol use: No    Alcohol/week: 0.0 standard drinks  . Drug use: No  . Sexual activity: Yes    Birth control/protection: Surgical  Other Topics Concern  . Not on file  Social History Narrative   Left handed   3 children   One story home   Drinks Dr. Malachi Bonds 2 cups daily   Social Determinants of Health   Financial Resource Strain:   . Difficulty of Paying Living Expenses:   Food Insecurity:   . Worried About Charity fundraiser in the Last Year:   . Arboriculturist in the Last Year:   Transportation Needs:   . Film/video editor (Medical):   Marland Kitchen Lack of Transportation (Non-Medical):   Physical Activity:   . Days of Exercise per Week:   . Minutes of Exercise per Session:   Stress:   . Feeling of Stress :   Social Connections:   . Frequency of Communication with Friends and Family:   . Frequency of Social Gatherings with Friends and Family:   . Attends Religious Services:   . Active Member of Clubs or Organizations:   . Attends Archivist Meetings:   Marland Kitchen Marital Status:   Intimate Partner  Violence:   . Fear of Current or Ex-Partner:   . Emotionally Abused:   Marland Kitchen Physically Abused:   . Sexually Abused:     PHYSICAL EXAM: Blood pressure (!) 157/83, pulse (!) 58, resp. rate 18, height 5\' 2"  (1.575 m), weight 189 lb (85.7 kg), SpO2 100 %. General: No acute distress.  Patient appears well-groomed.   Head:  Normocephalic/atraumatic Eyes:  Fundi examined but not visualized Neck: supple, no paraspinal tenderness, full range of motion Heart:  Regular rate and rhythm Lungs:  Clear to auscultation bilaterally Back: No paraspinal tenderness Neurological Exam: alert and oriented to person, place, and time. Attention span and concentration intact, recent and remote memory intact, fund of knowledge intact.  Speech fluent and not dysarthric, language intact.  CN II-XII intact. Bulk and tone normal, muscle strength 5/5 throughout.  Sensation to light touch, temperature and vibration intact.  Deep tendon reflexes 2+ throughout, toes downgoing.  Finger to nose and heel to shin testing intact.  Gait normal, Romberg negative.  IMPRESSION: Migraine with aura, without status migrainosus, not intractable.  Headache frequency varies from month to month, usually due to weather.  Will try to obtain better control with increase in topiramate.  PLAN: 1.  For preventative management, increase topiramate to 50mg  at bedtime 2.  For abortive therapy, rizatriptan 10mg  3.  Limit use of pain relievers to no more than 2 days out of week to prevent risk of rebound or medication-overuse headache. 4.  Keep headache diary 5.  Exercise, hydration, caffeine cessation, sleep hygiene, monitor for and avoid triggers 6.  Follow up 5 months   Metta Clines, DO

## 2019-12-08 ENCOUNTER — Other Ambulatory Visit: Payer: Self-pay

## 2019-12-08 ENCOUNTER — Ambulatory Visit (INDEPENDENT_AMBULATORY_CARE_PROVIDER_SITE_OTHER): Payer: BC Managed Care – PPO | Admitting: Neurology

## 2019-12-08 ENCOUNTER — Encounter: Payer: Self-pay | Admitting: Neurology

## 2019-12-08 VITALS — BP 157/83 | HR 58 | Resp 18 | Ht 62.0 in | Wt 189.0 lb

## 2019-12-08 DIAGNOSIS — G43109 Migraine with aura, not intractable, without status migrainosus: Secondary | ICD-10-CM | POA: Diagnosis not present

## 2019-12-08 MED ORDER — RIZATRIPTAN BENZOATE 10 MG PO TABS: ORAL_TABLET | ORAL | 5 refills | Status: DC

## 2019-12-08 MED ORDER — TOPIRAMATE 50 MG PO TABS: 50.0000 mg | ORAL_TABLET | Freq: Every day | ORAL | 4 refills | Status: DC

## 2019-12-08 NOTE — Patient Instructions (Signed)
  1. Increase topiramate to 50mg  at bedtime 2. Take rizatriptan 10mg  at earliest onset of headache.  May repeat dose once in 2 hours if needed.  Maximum 2 tablets in 24 hours. 3. Limit use of pain relievers to no more than 2 days out of the week.  These medications include acetaminophen, NSAIDs (ibuprofen/Advil/Motrin, naproxen/Aleve, triptans (Imitrex/sumatriptan), Excedrin, and narcotics.  This will help reduce risk of rebound headaches. 4. Be aware of common food triggers:  - Caffeine:  coffee, black tea, cola, Mt. Dew  - Chocolate  - Dairy:  aged cheeses (brie, blue, cheddar, gouda, [place], provolone, [place], Swiss, etc), chocolate milk, buttermilk, sour cream, limit eggs and yogurt  - Nuts, peanut butter  - Alcohol  - Cereals/grains:  FRESH breads (fresh bagels, sourdough, doughnuts), yeast productions  - Processed/canned/aged/cured meats (pre-packaged deli meats, hotdogs)  - MSG/glutamate:  soy sauce, flavor enhancer, pickled/preserved/marinated foods  - Sweeteners:  aspartame (Equal, Nutrasweet).  Sugar and Splenda are okay  - Vegetables:  legumes (lima beans, lentils, snow peas, fava beans, pinto peans, peas, garbanzo beans), sauerkraut, onions, olives, pickles  - Fruit:  avocados, bananas, citrus fruit (orange, lemon, grapefruit), mango  - Other:  Frozen meals, macaroni and cheese 5. Routine exercise 6. Stay adequately hydrated (aim for 64 oz water daily) 7. Keep headache diary 8. Maintain proper stress management 9. Maintain proper sleep hygiene 10. Do not skip meals 11. Consider supplements:  magnesium citrate 400mg  daily, riboflavin 400mg  daily, coenzyme Q10 100mg  three times daily.

## 2020-03-04 ENCOUNTER — Other Ambulatory Visit: Payer: Self-pay | Admitting: Neurology

## 2020-05-06 NOTE — Progress Notes (Signed)
Virtual Visit via Video Note The purpose of this virtual visit is to provide medical care while limiting exposure to the novel coronavirus.    Consent was obtained for video visit:  Yes.   Answered questions that patient had about telehealth interaction:  Yes.   I discussed the limitations, risks, security and privacy concerns of performing an evaluation and management service by telemedicine. I also discussed with the patient that there may be a patient responsible charge related to this service. The patient expressed understanding and agreed to proceed.  Pt location: Home Physician Location: office Name of referring provider:  Malva Limes, MD I connected with Denise Stephens at patients initiation/request on 05/09/2020 at  1:30 PM EST by video enabled telemedicine application and verified that I am speaking with the correct person using two identifiers. Pt MRN:  [id number] Pt DOB:  July 08, 1970 Video Participants:  Denise Stephens   History of Present Illness:  Denise Stephens is a 49year old female who follows up for migraines.  UPDATE: Topiramate increased in June. Intensity:6-8/10 Duration:Usually within an hour Frequency:Nov - 0, Oct - 0, Sept -1, Aug -2, July -1 Frequency of abortive medication:usually a couple of times a month Current NSAIDS:none Current analgesics:BC powder (rare) Current triptans:rizatriptan 10mg  Current ergotamine:none Current anti-emetic:none Current muscle relaxants:none Current Antihypertensive medications:none Current Antidepressant medications:none Current Anticonvulsant medications:topiramate 50mg  at bedtime Current anti-CGRP:none Current Vitamins/Herbal/Supplements:ferrous sulfate Current Antihistamines/Decongestants:none Other therapy:none Hormone/birth control:none Other medications:tranexamic acid  Caffeine:No coffee. Dr. 2 glasses a  day Alcohol:no Smoker:no Diet:32 oz water daily. Does not skip meals Exercise:no Depression:no; Anxiety:no Other pain:no Sleep hygiene:Insomnia (prescribed Xanax but hasn't tried it yet)  HISTORY: Onset:Infrequent since childhood. Since about 49 years old, worse over past year. Location:Back of head and radiate up to forehead bilaterally Quality:Pressure behind yes, otherwise throbbing Initial intensity:6-8/10.Shedenies new headache, thunderclap headache Aura:Occasional kaleidoscopic vision. Sometimes sees floaters or gray spots with blurred peripheral vision (occurs with headache) Premonitory Phase:no Postdrome:no Associated symptoms:Photophobia, phonophobia Shedenies nausea, vomiting andassociated unilateral numbness or weakness. Initial duration:1 day (longest one lasted 4 days on 03/01/2019. This one involved only left side of head and had blurred vision in only left eye. Currently going through pre-menopause) Initial frequency:8 to 10 a month Initial frequency of abortive medication:BC powder 6 times a month Triggers:Hormonal (going through pre-menopause, menorrhagia); emotional stress; flavored rice; soy; certain yogurt; certain spices (Mrs. 03-06-1978); certain scents (floral perfumes, car smells) Relieving factors:BC powder Activity:aggravates  Past NSAIDS:Advil Migraine, ibuprofen, naproxen Past analgesics:Excedrin Migraine, Tylenol, BC powder Past abortive triptans:none Past abortive ergotamine:none Past muscle relaxants:none Past anti-emetic:none Past antihypertensive medications:none Past antidepressant medications:none Past anticonvulsant medications:none Past anti-CGRP:none Other past therapies:none   Family history of headache:Mother (migraines). No family history of aneurysms.  Past Medical History: Past Medical History:  Diagnosis Date  . Lobar pneumonia (HCC) 05/04/2015     Medications: Outpatient Encounter Medications as of 05/09/2020  Medication Sig  . ALPRAZolam (XANAX) 0.5 MG tablet Take 1 tablet (0.5 mg total) by mouth at bedtime as needed for sleep.  . Ferrous Sulfate (IRON) 28 MG TABS Take by mouth.  . naproxen (NAPROSYN) 500 MG tablet Take 1 tablet (500 mg total) by mouth 2 (two) times daily with a meal. As needed for pain  . nystatin cream (MYCOSTATIN) Apply 1 application topically 2 (two) times daily.  . rizatriptan (MAXALT) 10 MG tablet Take 1 tablet earliest onset of migraine.  May repeat in 2 hours if needed.  Maximum 2 tablets in 24  hours.  . topiramate (TOPAMAX) 50 MG tablet Take 1 tablet (50 mg total) by mouth at bedtime.  . tranexamic acid (LYSTEDA) 650 MG TABS tablet Take 2 tablets (1,300 mg total) by mouth 3 (three) times daily. Take during menses for a maximum of five days   No facility-administered encounter medications on file as of 05/09/2020.    Allergies: Allergies  Allergen Reactions  . Sulfa Antibiotics     Family History: Family History  Problem Relation Age of Onset  . Heart disease Father   . Bone cancer Father 102  . Lung cancer Father 38  . COPD Father   . Thyroid disease Sister   . Colon cancer Paternal Grandfather 82    Social History: Social History   Socioeconomic History  . Marital status: Married    Spouse name: Not on file  . Number of children: 3  . Years of education: 16  . Highest education level: Not on file  Occupational History  . Occupation: unemployed  Tobacco Use  . Smoking status: Never Smoker  . Smokeless tobacco: Never Used  Vaping Use  . Vaping Use: Never used  Substance and Sexual Activity  . Alcohol use: No    Alcohol/week: 0.0 standard drinks  . Drug use: No  . Sexual activity: Yes    Birth control/protection: Surgical  Other Topics Concern  . Not on file  Social History Narrative   Left handed   3 children   One story home   Drinks Dr. Reino Kent 2 cups daily   Social  Determinants of Health   Financial Resource Strain:   . Difficulty of Paying Living Expenses: Not on file  Food Insecurity:   . Worried About Programme researcher, broadcasting/film/video in the Last Year: Not on file  . Ran Out of Food in the Last Year: Not on file  Transportation Needs:   . Lack of Transportation (Medical): Not on file  . Lack of Transportation (Non-Medical): Not on file  Physical Activity:   . Days of Exercise per Week: Not on file  . Minutes of Exercise per Session: Not on file  Stress:   . Feeling of Stress : Not on file  Social Connections:   . Frequency of Communication with Friends and Family: Not on file  . Frequency of Social Gatherings with Friends and Family: Not on file  . Attends Religious Services: Not on file  . Active Member of Clubs or Organizations: Not on file  . Attends Banker Meetings: Not on file  . Marital Status: Not on file  Intimate Partner Violence:   . Fear of Current or Ex-Partner: Not on file  . Emotionally Abused: Not on file  . Physically Abused: Not on file  . Sexually Abused: Not on file    Observations/Objective:   Height 5\' 1"  (1.549 m), weight 181 lb (82.1 kg). No acute distress.  Alert and oriented.  Speech fluent and not dysarthric.  Language intact.  Eyes orthophoric on primary gaze.  Face symmetric.  Assessment and Plan:   Migraine with aura, without status migrainosus, not intractable.  1.  Migraine prevention:  topiramate 50mg  at bedtime 2.  Migraine rescue:  Rizatriptan 10mg  3.  Limit use of pain relievers to no more than 2 days out of week to prevent risk of rebound or medication-overuse headache. 4.  Keep headache diary 5.  Follow up 9 months.  Follow Up Instructions:    -I discussed the assessment and treatment plan with the  patient. The patient was provided an opportunity to ask questions and all were answered. The patient agreed with the plan and demonstrated an understanding of the instructions.   The patient was  advised to call back or seek an in-person evaluation if the symptoms worsen or if the condition fails to improve as anticipated.    Cira Servant, DO

## 2020-05-09 ENCOUNTER — Telehealth (INDEPENDENT_AMBULATORY_CARE_PROVIDER_SITE_OTHER): Payer: BC Managed Care – PPO | Admitting: Neurology

## 2020-05-09 ENCOUNTER — Encounter: Payer: Self-pay | Admitting: Neurology

## 2020-05-09 ENCOUNTER — Other Ambulatory Visit: Payer: Self-pay

## 2020-05-09 VITALS — Ht 61.0 in | Wt 181.0 lb

## 2020-05-09 DIAGNOSIS — G43109 Migraine with aura, not intractable, without status migrainosus: Secondary | ICD-10-CM | POA: Diagnosis not present

## 2020-05-09 MED ORDER — TOPIRAMATE 50 MG PO TABS: 50.0000 mg | ORAL_TABLET | Freq: Every day | ORAL | 8 refills | Status: DC

## 2020-05-14 ENCOUNTER — Other Ambulatory Visit: Payer: Self-pay

## 2020-05-14 ENCOUNTER — Encounter: Payer: Self-pay | Admitting: Emergency Medicine

## 2020-05-14 ENCOUNTER — Ambulatory Visit: Payer: BC Managed Care – PPO

## 2020-05-14 ENCOUNTER — Ambulatory Visit
Admission: EM | Admit: 2020-05-14 | Discharge: 2020-05-14 | Disposition: A | Discharge status: Home / Self Care | Payer: BC Managed Care – PPO | Attending: Emergency Medicine | Admitting: Emergency Medicine

## 2020-05-14 ENCOUNTER — Ambulatory Visit (INDEPENDENT_AMBULATORY_CARE_PROVIDER_SITE_OTHER): Payer: BC Managed Care – PPO

## 2020-05-14 DIAGNOSIS — U071 COVID-19: Secondary | ICD-10-CM

## 2020-05-14 DIAGNOSIS — J189 Pneumonia, unspecified organism: Secondary | ICD-10-CM | POA: Diagnosis not present

## 2020-05-14 DIAGNOSIS — R059 Cough, unspecified: Secondary | ICD-10-CM

## 2020-05-14 MED ORDER — PREDNISONE 50 MG PO TABS: 50.0000 mg | ORAL_TABLET | Freq: Every day | ORAL | 0 refills | Status: DC

## 2020-05-14 MED ORDER — DOXYCYCLINE HYCLATE 100 MG PO CAPS: 100.0000 mg | ORAL_CAPSULE | Freq: Two times a day (BID) | ORAL | 0 refills | Status: DC

## 2020-05-14 NOTE — ED Triage Notes (Signed)
Patient was diagnosed with COVID on 05/02/20.  Patient c/o ongoing cough and chest congestion and low grade fever.

## 2020-05-14 NOTE — Discharge Instructions (Addendum)
You were seen for cough and fever and are being treated for pneumonia.   Take the antibiotics as prescribed until they're finished. If you think you're having a reaction, stop the medication, take benadryl and go to the nearest urgent care/emergency room. Take a probiotic while taking the antibiotic to decrease the chances of stomach upset.   Continue to isolate at home due to your positive Covid test.  If you are unable to control your fever with over-the-counter medications, your breathing becomes more difficult, or your fatigue increases, you may need further evaluation in the emergency department (ER).   Take care, Dr. Sharlet Salina, NP-c

## 2020-05-15 NOTE — ED Provider Notes (Signed)
[facility] [place]   Name: Denise Stephens DOB: Apr 26, 1971 MRN: [id number] CSN: [id number] PCP: Malva Limes, MD  Arrival date and time:  05/14/20 1513  Chief Complaint:  Cough   NOTE: Prior to seeing the patient today, I have reviewed the triage nursing documentation and vital signs. Clinical staff has updated patient's PMH/PSHx, current medication list, and drug allergies/intolerances to ensure comprehensive history available to assist in medical decision making.   History:   HPI: Denise Stephens is a 49 y.o. female who presents today with complaints of increased cough and chest congestion in the setting of known COVID-19.  Patient was diagnosed with COVID-19 on 05/02/2020 and has been self isolating since.  She states her symptoms were mild initially but over the past 48 hours have since progressed.  Her cough has become more productive and she has had occasional low-grade fever.  Her work of breathing is also increased.  Patient is not a smoker.  Patient has not vaccine against COVID-19.   Past Medical History:  Diagnosis Date  . Lobar pneumonia (HCC) 05/04/2015    Past Surgical History:  Procedure Laterality Date  . CESAREAN SECTION  200, 2002, 2009   x3  . TUBAL LIGATION      Family History  Problem Relation Age of Onset  . Heart disease Father   . Bone cancer Father 5  . Lung cancer Father 23  . COPD Father   . Thyroid disease Sister   . Colon cancer Paternal Grandfather 66    Social History   Tobacco Use  . Smoking status: Never Smoker  . Smokeless tobacco: Never Used  Vaping Use  . Vaping Use: Never used  Substance Use Topics  . Alcohol use: No    Alcohol/week: 0.0 standard drinks  . Drug use: No    Patient Active Problem List   Diagnosis Date Noted  . Blood in the urine 05/04/2015  . IBS (irritable bowel syndrome) 05/04/2015  . Excess, menstruation 05/04/2015  . Strain of trapezius muscle  05/04/2015  . Candida vaginitis 05/04/2015  . Vitamin D deficiency 05/04/2015    Home Medications:    Current Meds  Medication Sig  . Ferrous Sulfate (IRON) 28 MG TABS Take by mouth.  . rizatriptan (MAXALT) 10 MG tablet Take 1 tablet earliest onset of migraine.  May repeat in 2 hours if needed.  Maximum 2 tablets in 24 hours.  . topiramate (TOPAMAX) 50 MG tablet Take 1 tablet (50 mg total) by mouth at bedtime.    Allergies:   Sulfa antibiotics  Review of Systems (ROS): Review of Systems  Constitutional: Positive for activity change, appetite change, fatigue and fever.  Respiratory: Positive for cough, chest tightness and shortness of breath.   Gastrointestinal: Negative for diarrhea and nausea.  Neurological: Negative for dizziness and headaches.  All other systems reviewed and are negative.    Vital Signs: Today's Vitals   05/14/20 1526 05/14/20 1529 05/14/20 1653  BP:  (!) 145/88   Pulse:  98   Resp:  16   Temp:  99 F (37.2 C)   TempSrc:  Oral   SpO2:  95%   Weight: 185 lb (83.9 kg)    Height: 5\' 1"  (1.549 m)    PainSc: 6   6     Physical Exam: Physical Exam Vitals and nursing note reviewed.  Constitutional:      Appearance: Normal appearance. She is ill-appearing.  HENT:  Right Ear: Tympanic membrane normal.     Left Ear: Tympanic membrane normal.     Mouth/Throat:     Mouth: Mucous membranes are moist.     Pharynx: Posterior oropharyngeal erythema present.  Eyes:     Pupils: Pupils are equal, round, and reactive to light.  Cardiovascular:     Rate and Rhythm: Regular rhythm. Tachycardia present.     Pulses: Normal pulses.     Heart sounds: Normal heart sounds.  Pulmonary:     Effort: Pulmonary effort is normal.     Breath sounds: Wheezing and rales present.  Skin:    General: Skin is warm and dry.     Capillary Refill: Capillary refill takes less than 2 seconds.  Neurological:     General: No focal deficit present.     Mental Status: She is  alert and oriented to person, place, and time.  Psychiatric:        Mood and Affect: Mood normal.        Thought Content: Thought content normal.      Urgent Care Treatments / Results:   LABS: PLEASE NOTE: all labs that were ordered this encounter are listed, however only abnormal results are displayed. Labs Reviewed - No data to display  EKG: -None  RADIOLOGY: DG Chest 2 View  Result Date: 05/14/2020 CLINICAL DATA:  Cough.  COVID-19 positive EXAM: CHEST - 2 VIEW COMPARISON:  March 04, 2018 FINDINGS: There is airspace opacity in the posterior segment of the left lower lobe. Lungs elsewhere are clear. Heart size and pulmonary vascularity are normal. No adenopathy. No bone lesions. IMPRESSION: Airspace opacity consistent with pneumonia in the posterior segment left lower lobe. Atypical organism pneumonia may present in this manner. Lungs elsewhere clear. Heart size normal. No adenopathy. These results will be called to the ordering clinician or representative by the Radiologist Assistant, and communication documented in the PACS or [employer]. Electronically Signed   By: Bretta Bang III M.D.   On: 05/14/2020 16:40    PROCEDURES: Procedures  MEDICATIONS RECEIVED THIS VISIT: Medications - No data to display  PERTINENT CLINICAL COURSE NOTES/UPDATES:   Initial Impression / Assessment and Plan / Urgent Care Course:  Pertinent labs & imaging results that were available during my care of the patient were personally reviewed by me and considered in my medical decision making (see lab/imaging section of note for values and interpretations).  Denise Stephens is a 49 y.o. female who presents to [facility] today with complaints of cough and chest congestion, diagnosed with pneumonia, and treated as such with the medications below. NP and patient reviewed discharge instructions below during visit.   Patient is well appearing overall in clinic today. She does  not appear to be in any acute distress. Presenting symptoms (see HPI) and exam as documented above.   I have reviewed the follow up and strict return precautions for any new or worsening symptoms. Patient is aware of symptoms that would be deemed urgent/emergent, and would thus require further evaluation either here or in the emergency department. At the time of discharge, she verbalized understanding and consent with the discharge plan as it was reviewed with her. All questions were fielded by provider and/or clinic staff prior to patient discharge.    Final Clinical Impressions / Urgent Care Diagnoses:   Final diagnoses:  Pneumonia of left lower lobe due to infectious organism    New Prescriptions:  [place] Controlled Substance Registry consulted? Not Applicable  Meds  ordered this encounter  Medications  . doxycycline (VIBRAMYCIN) 100 MG capsule    Sig: Take 1 capsule (100 mg total) by mouth 2 (two) times daily.    Dispense:  20 capsule    Refill:  0  . predniSONE (DELTASONE) 50 MG tablet    Sig: Take 1 tablet (50 mg total) by mouth daily with breakfast.    Dispense:  5 tablet    Refill:  0      Discharge Instructions     You were seen for cough and fever and are being treated for pneumonia.   Take the antibiotics as prescribed until they're finished. If you think you're having a reaction, stop the medication, take benadryl and go to the nearest urgent care/emergency room. Take a probiotic while taking the antibiotic to decrease the chances of stomach upset.   Continue to isolate at home due to your positive Covid test.  If you are unable to control your fever with over-the-counter medications, your breathing becomes more difficult, or your fatigue increases, you may need further evaluation in the emergency department (ER).   Take care, Dr. Sharlet Salina, NP-c     Recommended Follow up Care:  Patient encouraged to follow up with the following provider within the specified time  frame, or sooner as dictated by the severity of her symptoms. As always, she was instructed that for any urgent/emergent care needs, she should seek care either here or in the emergency department for more immediate evaluation.   Bailey Mech, DNP, NP-c    Bailey Mech, NP 05/15/20 [phone]

## 2020-05-25 ENCOUNTER — Other Ambulatory Visit: Payer: Self-pay

## 2020-05-25 ENCOUNTER — Encounter: Payer: Self-pay | Admitting: Family Medicine

## 2020-05-25 ENCOUNTER — Telehealth (INDEPENDENT_AMBULATORY_CARE_PROVIDER_SITE_OTHER): Payer: BC Managed Care – PPO | Admitting: Family Medicine

## 2020-05-25 ENCOUNTER — Ambulatory Visit
Admission: RE | Admit: 2020-05-25 | Discharge: 2020-05-25 | Disposition: A | Discharge status: Home / Self Care | Payer: BC Managed Care – PPO | Source: Ambulatory Visit | Attending: Family Medicine | Admitting: Family Medicine

## 2020-05-25 ENCOUNTER — Ambulatory Visit
Admission: RE | Admit: 2020-05-25 | Discharge: 2020-05-25 | Disposition: A | Discharge status: Home / Self Care | Payer: BC Managed Care – PPO | Attending: Family Medicine | Admitting: Family Medicine

## 2020-05-25 VITALS — Temp 97.1°F

## 2020-05-25 DIAGNOSIS — R0602 Shortness of breath: Secondary | ICD-10-CM

## 2020-05-25 DIAGNOSIS — B3731 Acute candidiasis of vulva and vagina: Secondary | ICD-10-CM

## 2020-05-25 DIAGNOSIS — R059 Cough, unspecified: Secondary | ICD-10-CM | POA: Insufficient documentation

## 2020-05-25 DIAGNOSIS — U071 COVID-19: Secondary | ICD-10-CM

## 2020-05-25 DIAGNOSIS — B373 Candidiasis of vulva and vagina: Secondary | ICD-10-CM | POA: Diagnosis not present

## 2020-05-25 MED ORDER — ALBUTEROL SULFATE HFA 108 (90 BASE) MCG/ACT IN AERS: 1.0000 | INHALATION_SPRAY | Freq: Four times a day (QID) | RESPIRATORY_TRACT | 2 refills | Status: DC | PRN

## 2020-05-25 MED ORDER — FLUCONAZOLE 150 MG PO TABS: 150.0000 mg | ORAL_TABLET | Freq: Once | ORAL | 0 refills | Status: AC

## 2020-05-25 MED ORDER — AZITHROMYCIN 250 MG PO TABS: ORAL_TABLET | ORAL | 0 refills | Status: AC

## 2020-05-25 NOTE — Progress Notes (Signed)
MyChart Video Visit    Virtual Visit via Video Note   This visit type was conducted due to national recommendations for restrictions regarding the COVID-19 Pandemic (e.g. social distancing) in an effort to limit this patient's exposure and mitigate transmission in our community. This patient is at least at moderate risk for complications without adequate follow up. This format is felt to be most appropriate for this patient at this time. Physical exam was limited by quality of the video and audio technology used for the visit.   Patient location: home Provider location: bfp  I discussed the limitations of evaluation and management by telemedicine and the availability of in person appointments. The patient expressed understanding and agreed to proceed.  Patient: Denise Stephens   DOB: 1970-06-27   49 y.o. Female  MRN: [id number] Visit Date: 05/25/2020  Today's healthcare provider: Mila Merry, MD   No chief complaint on file.  Subjective    HPI  Follow up Urgent care visit  Patient was seen at [facility] urgent care for increased cough and chest congestion  on 05/14/2020. Patient was had positive COVID-19 test on 05/02/2020, 2 days after onset of chills, muscle aches.  She was treated for pneumonia of left lower lobe. Chest CXR 05/14/2020 Airspace opacity consistent with pneumonia in the posterior segment left lower lobe. Atypical organism pneumonia may present in this manner. Lungs elsewhere clear. Heart size normal. No adenopathy. Treatment for this included Doxycyline 100mg  twice daily for 10 days, and Prednisone 50mg  daily for 5 days. She reports good compliance with treatment. She reports this condition is Unchanged. She reports the coughing and shortness of breath is no better. She believes she has developed a yeast infection since taking the antibiotic. She complains of a white itchy vaginal discharge for the past 4 days.  Coughing is productive of clear or  white discharge. Shortness of breath started getting worse the last couple of days. 93-95% oxygen saturations. Not having muscle aches or fever any more.  -----------------------------------------------------------------------------------------     Medications: Outpatient Medications Prior to Visit  Medication Sig  . ALPRAZolam (XANAX) 0.5 MG tablet Take 1 tablet (0.5 mg total) by mouth at bedtime as needed for sleep. (Patient not taking: Reported on 05/09/2020)  . doxycycline (VIBRAMYCIN) 100 MG capsule Take 1 capsule (100 mg total) by mouth 2 (two) times daily.  . Ferrous Sulfate (IRON) 28 MG TABS Take by mouth.  . naproxen (NAPROSYN) 500 MG tablet Take 1 tablet (500 mg total) by mouth 2 (two) times daily with a meal. As needed for pain  . nystatin cream (MYCOSTATIN) Apply 1 application topically 2 (two) times daily.  . predniSONE (DELTASONE) 50 MG tablet Take 1 tablet (50 mg total) by mouth daily with breakfast.  . rizatriptan (MAXALT) 10 MG tablet Take 1 tablet earliest onset of migraine.  May repeat in 2 hours if needed.  Maximum 2 tablets in 24 hours.  . topiramate (TOPAMAX) 50 MG tablet Take 1 tablet (50 mg total) by mouth at bedtime.  . tranexamic acid (LYSTEDA) 650 MG TABS tablet Take 2 tablets (1,300 mg total) by mouth 3 (three) times daily. Take during menses for a maximum of five days (Patient not taking: Reported on 05/09/2020)   No facility-administered medications prior to visit.    Review of Systems  Constitutional: Positive for fatigue. Negative for appetite change, chills and fever.  HENT: Negative for congestion.   Respiratory: Positive for cough (productive mucus with clear -white ) and shortness of  breath. Negative for chest tightness.   Cardiovascular: Negative for chest pain and palpitations.  Gastrointestinal: Negative for abdominal pain, nausea and vomiting.  Genitourinary:       White itchy vaginal discharge  Neurological: Negative for dizziness and weakness.       Objective    LMP 04/27/2020 (Approximate)    Physical Exam   Awake, alert, oriented x 3. In no apparent distress   Assessment & Plan     1. COVID-19 Now 3 weeks post onset of symptoms.   2. Cough Has completely steroids and doxycycline prescribed 06-03-2020. Had improved but sx never resolved, but now worsening over the last 3 days.   - DG Chest 2 View; Future - Start azithromycin and albuterol inhaler.   3. Shortness of breath  - DG Chest 2 View; Future   Has developed sx of vaginal yeast infection since recent course of antibiotic     I discussed the assessment and treatment plan with the patient. The patient was provided an opportunity to ask questions and all were answered. The patient agreed with the plan and demonstrated an understanding of the instructions.   The patient was advised to call back or seek an in-person evaluation if the symptoms worsen or if the condition fails to improve as anticipated.  I provided 13 minutes of non-face-to-face time during this encounter.  The entirety of the information documented in the History of Present Illness, Review of Systems and Physical Exam were personally obtained by me. Portions of this information were initially documented by the CMA and reviewed by me for thoroughness and accuracy.     Mila Merry, MD [facility] [phone] (phone) [phone] (fax)  [facility]

## 2020-06-06 ENCOUNTER — Encounter: Payer: Self-pay | Admitting: Family Medicine

## 2020-06-06 ENCOUNTER — Telehealth (INDEPENDENT_AMBULATORY_CARE_PROVIDER_SITE_OTHER): Payer: BC Managed Care – PPO | Admitting: Family Medicine

## 2020-06-06 ENCOUNTER — Other Ambulatory Visit: Payer: Self-pay

## 2020-06-06 DIAGNOSIS — Z8616 Personal history of COVID-19: Secondary | ICD-10-CM

## 2020-06-06 DIAGNOSIS — J40 Bronchitis, not specified as acute or chronic: Secondary | ICD-10-CM | POA: Diagnosis not present

## 2020-06-06 MED ORDER — AZITHROMYCIN 250 MG PO TABS: ORAL_TABLET | ORAL | 0 refills | Status: DC

## 2020-06-06 MED ORDER — BENZONATATE 100 MG PO CAPS: 100.0000 mg | ORAL_CAPSULE | Freq: Two times a day (BID) | ORAL | 0 refills | Status: DC | PRN

## 2020-06-06 MED ORDER — FLUTICASONE FUROATE-VILANTEROL 200-25 MCG/INH IN AEPB: 1.0000 | INHALATION_SPRAY | Freq: Every day | RESPIRATORY_TRACT | 1 refills | Status: DC

## 2020-06-06 NOTE — Progress Notes (Signed)
MyChart Video Visit    Virtual Visit via Video Note   This visit type was conducted due to national recommendations for restrictions regarding the COVID-19 Pandemic (e.g. social distancing) in an effort to limit this patient's exposure and mitigate transmission in our community. This patient is at least at moderate risk for complications without adequate follow up. This format is felt to be most appropriate for this patient at this time. Physical exam was limited by quality of the video and audio technology used for the visit.   Patient location: home Provider location: office  I discussed the limitations of evaluation and management by telemedicine and the availability of in person appointments. The patient expressed understanding and agreed to proceed.  Patient: Denise Stephens Name   DOB: Mar 30, 1971   49 y.o. Female  MRN: [id number] Visit Date: 06/06/2020  Today's healthcare provider: Dortha Kern, PA-C   No chief complaint on file.  Subjective    HPI  Patient is a 49 year old female who presents for continued, now worsening cough since having Covid-19.  Patient was diagnosed with Covid on 05/02/20.  She has since had 2 rounds of antibiotics and 1 round of steroids.  She was feeling better with her Covid symptoms except for the cough.  That has lingered.  On 06/02/20 she began having a worse cough.  He throat, back and chest are hurting because she says she is coughing so much.  She denies any fever and is currently not on any cough medication.  Past Medical History:  Diagnosis Date   Lobar pneumonia (HCC) 05/04/2015   Past Surgical History:  Procedure Laterality Date   CESAREAN SECTION  200, 2002, 2009   x3   TUBAL LIGATION     Social History   Tobacco Use   Smoking status: Never Smoker   Smokeless tobacco: Never Used  Vaping Use   Vaping Use: Never used  Substance Use Topics   Alcohol use: No    Alcohol/week: 0.0 standard drinks   Drug use: No    Family History  Problem Relation Age of Onset   Heart disease Father    Bone cancer Father 25   Lung cancer Father 54   COPD Father    Thyroid disease Sister    Colon cancer Paternal Grandfather 67   Allergies  Allergen Reactions   Sulfa Antibiotics       Medications: Outpatient Medications Prior to Visit  Medication Sig   albuterol (VENTOLIN HFA) 108 (90 Base) MCG/ACT inhaler Inhale 1-2 puffs into the lungs every 6 (six) hours as needed for shortness of breath.   Ferrous Sulfate (IRON) 28 MG TABS Take by mouth.   naproxen (NAPROSYN) 500 MG tablet Take 1 tablet (500 mg total) by mouth 2 (two) times daily with a meal. As needed for pain   nystatin cream (MYCOSTATIN) Apply 1 application topically 2 (two) times daily.   rizatriptan (MAXALT) 10 MG tablet Take 1 tablet earliest onset of migraine.  May repeat in 2 hours if needed.  Maximum 2 tablets in 24 hours.   topiramate (TOPAMAX) 50 MG tablet Take 1 tablet (50 mg total) by mouth at bedtime.   tranexamic acid (LYSTEDA) 650 MG TABS tablet Take 2 tablets (1,300 mg total) by mouth 3 (three) times daily. Take during menses for a maximum of five days   ALPRAZolam (XANAX) 0.5 MG tablet Take 1 tablet (0.5 mg total) by mouth at bedtime as needed for sleep. (Patient not taking: No sig reported)  No facility-administered medications prior to visit.    Review of Systems  HENT: Positive for congestion, ear pain (right) and sore throat (cough). Negative for postnasal drip, rhinorrhea, sinus pressure, sinus pain, sneezing and tinnitus.   Respiratory: Positive for cough, shortness of breath and wheezing.   Cardiovascular: Positive for chest pain (from coughing).  Musculoskeletal: Positive for back pain (from coughing). Negative for myalgias.  Neurological: Negative for dizziness and headaches.      Objective    Temperature 98.6, Pulse oximetry 95-97%   Physical Exam: WDWN female in no apparent distress.  Head:  Normocephalic, atraumatic. Neck: Supple, NROM Respiratory: No apparent distress except frequent cough. Able to talk in complete sentences and no acute dyspnea. Psych: Normal mood and affect      Assessment & Plan     1. Bronchitis Developed wheezing, crackles in her throat at night and cough on 06-02-20. Progressed to more cough and clear sputum, back and chest aching, and throat hurting only with a cough. No fever. Albuterol some slight help. Has a history of COVID infection 05-02-20 with lobar pneumonia 05-14-20. States she got better after a Z-pak on 05-25-20. Seemed symptoms abated until this past Thursday. Will treat with Breo, Z-pak and Tessalon Perles for cough and wheeze control. May use Albuterol prn rescue. Increase fluids and maintain COVID restrictions. Denies any fever. Call report of progress in 5-7 days to see if a repeat CXR is needed and/or a pulmonology referral. - fluticasone furoate-vilanterol (BREO ELLIPTA) 200-25 MCG/INH AEPB; Inhale 1 puff into the lungs daily.  Dispense: 28 each; Refill: 1 - benzonatate (TESSALON) 100 MG capsule; Take 1 capsule (100 mg total) by mouth 2 (two) times daily as needed for cough.  Dispense: 20 capsule; Refill: 0 - azithromycin (ZITHROMAX) 250 MG tablet; Take 2 tablets by mouth today then 1 daily for 4 days.  Dispense: 6 tablet; Refill: 0  2. History of COVID-19 Positive COVID test on 05-02-20 that progressed to lobar pneumonia by 05-14-20. Was treated with Doxycycline but no improvement until treatment changed to a Z-pak on 05-25-20.   No follow-ups on file.     I discussed the assessment and treatment plan with the patient. The patient was provided an opportunity to ask questions and all were answered. The patient agreed with the plan and demonstrated an understanding of the instructions.   The patient was advised to call back or seek an in-person evaluation if the symptoms worsen or if the condition fails to improve as anticipated.  I  provided 22 minutes of non-face-to-face time during this encounter.  I, Vineta Carone, PA-C, have reviewed all documentation for this visit. The documentation on 06/06/20 for the exam, diagnosis, procedures, and orders are all accurate and complete.   Dortha Kern, PA-C [employer] [phone] (phone) [phone] (fax)  [facility]

## 2020-07-28 ENCOUNTER — Other Ambulatory Visit: Payer: Self-pay | Admitting: Family Medicine

## 2020-07-28 DIAGNOSIS — J40 Bronchitis, not specified as acute or chronic: Secondary | ICD-10-CM

## 2020-07-28 NOTE — Telephone Encounter (Signed)
Requested medications are due for refill today yes  Requested medications are on the active medication list yes  Last refill 12/20  Last visit 12/20  Future visit scheduled no  Notes to clinic This was from a visit for bronchitis, unsure if was to be continued, please assess.

## 2021-02-03 NOTE — Progress Notes (Signed)
NEUROLOGY FOLLOW UP OFFICE NOTE  Denise Stephens [id number]  Assessment/Plan:   Migraine with aura, without status migrainosus, not intractable - doing well  Migraine prevention:  topiramate 50mg  at bedtime Migraine rescue:  rizatriptan 10mg  Limit use of pain relievers to no more than 2 days out of week to prevent risk of rebound or medication-overuse headache. Keep headache diary Follow up one year   Subjective:  Denise Stephens is a 50 year old female who follows up for migraines.   UPDATE: Intensity:  6-8/10 Duration:  Usually within an hour Frequency:  0 to 2 a month Frequency of abortive medication: usually a couple of times a month Current NSAIDS:  naproxen Current analgesics:  none Current triptans:  rizatriptan 10mg  Current ergotamine:  none Current anti-emetic:  none Current muscle relaxants:  none Current Antihypertensive medications:  none Current Antidepressant medications:  none Current Anticonvulsant medications:  topiramate 50mg  at bedtime Current anti-CGRP:  none Current Vitamins/Herbal/Supplements:  none Current Antihistamines/Decongestants:  none Other therapy:  none Hormone/birth control:  none Other medications:  tranexamic acid   Caffeine:  No coffee.  Dr. 57 2 glasses a day Alcohol:  no Smoker:  no Diet:  32 oz water daily.  Does not skip meals Exercise:  no Depression:  no; Anxiety:  no Other pain:  no Sleep hygiene:  Sleep is better.     HISTORY:  Onset:  Infrequent since childhood.  Since about 50 years old, worse over past year. Location:  Back of head and radiate up to forehead bilaterally Quality:  Pressure behind yes, otherwise throbbing Initial intensity:  6-8/10.  She denies new headache, thunderclap headache Aura: Occasional kaleidoscopic vision.  Sometimes sees floaters or gray spots with blurred peripheral vision (occurs with headache) Premonitory Phase:  no Postdrome:  no Associated symptoms:   Photophobia, phonophobia  She denies nausea, vomiting and associated unilateral numbness or weakness. Initial duration:  1 day (longest one lasted 4 days on 03/01/2019.  This one involved only left side of head and had blurred vision in only left eye.  Currently going through pre-menopause) Initial frequency:  8 to 10 a month Initial frequency of abortive medication: BC powder 6 times a month Triggers:  Hormonal (going through pre-menopause, menorrhagia); emotional stress; flavored rice; soy; certain yogurt; certain spices (Mrs. Reino Kent); certain scents (floral perfumes, car smells) Relieving factors:  BC powder Activity:  aggravates   Past NSAIDS:  Advil Migraine, ibuprofen, naproxen Past analgesics:  Excedrin Migraine, Tylenol, BC powder Past abortive triptans:  none Past abortive ergotamine:  none Past muscle relaxants:  none Past anti-emetic:  none Past antihypertensive medications:  none Past antidepressant medications:  none Past anticonvulsant medications:  none Past anti-CGRP:  none Other past therapies:  none     Family history of headache:  Mother (migraines).  No family history of aneurysms.  PAST MEDICAL HISTORY: Past Medical History:  Diagnosis Date   Lobar pneumonia (HCC) 05/04/2015    MEDICATIONS: Current Outpatient Medications on File Prior to Visit  Medication Sig Dispense Refill   albuterol (VENTOLIN HFA) 108 (90 Base) MCG/ACT inhaler Inhale 1-2 puffs into the lungs every 6 (six) hours as needed for shortness of breath. 1 each 2   ALPRAZolam (XANAX) 0.5 MG tablet Take 1 tablet (0.5 mg total) by mouth at bedtime as needed for sleep. (Patient not taking: No sig reported) 30 tablet 5   azithromycin (ZITHROMAX) 250 MG tablet Take 2 tablets by mouth today then 1 daily for 4 days. 6  tablet 0   benzonatate (TESSALON) 100 MG capsule Take 1 capsule (100 mg total) by mouth 2 (two) times daily as needed for cough. 20 capsule 0   Ferrous Sulfate (IRON) 28 MG TABS Take by mouth.      fluticasone furoate-vilanterol (BREO ELLIPTA) 200-25 MCG/INH AEPB Inhale 1 puff into the lungs daily. 180 each 1   naproxen (NAPROSYN) 500 MG tablet Take 1 tablet (500 mg total) by mouth 2 (two) times daily with a meal. As needed for pain 60 tablet 4   nystatin cream (MYCOSTATIN) Apply 1 application topically 2 (two) times daily. 30 g 3   rizatriptan (MAXALT) 10 MG tablet Take 1 tablet earliest onset of migraine.  May repeat in 2 hours if needed.  Maximum 2 tablets in 24 hours. 10 tablet 5   topiramate (TOPAMAX) 50 MG tablet Take 1 tablet (50 mg total) by mouth at bedtime. 30 tablet 8   tranexamic acid (LYSTEDA) 650 MG TABS tablet Take 2 tablets (1,300 mg total) by mouth 3 (three) times daily. Take during menses for a maximum of five days 30 tablet 11   No current facility-administered medications on file prior to visit.    ALLERGIES: Allergies  Allergen Reactions   Sulfa Antibiotics     FAMILY HISTORY: Family History  Problem Relation Age of Onset   Heart disease Father    Bone cancer Father 6   Lung cancer Father 66   COPD Father    Thyroid disease Sister    Colon cancer Paternal Grandfather 42      Objective:  Blood pressure 140/82, pulse 83, height 5\' 1"  (1.549 m), weight 182 lb 12.8 oz (82.9 kg), SpO2 99 %. General: No acute distress.  Patient appears well-groomed.   Head:  Normocephalic/atraumatic Eyes:  Fundi examined but not visualized Neck: supple, no paraspinal tenderness, full range of motion Heart:  Regular rate and rhythm Lungs:  Clear to auscultation bilaterally Back: No paraspinal tenderness Neurological Exam: alert and oriented to person, place, and time.  Speech fluent and not dysarthric, language intact.  CN II-XII intact. Bulk and tone normal, muscle strength 5/5 throughout.  Sensation to light touch intact.  Deep tendon reflexes 2+ throughout, toes downgoing.  Finger to nose testing intact.  Gait normal, Romberg negative.   , DO  CC: Shon Millet, MD

## 2021-02-06 ENCOUNTER — Other Ambulatory Visit: Payer: Self-pay

## 2021-02-06 ENCOUNTER — Ambulatory Visit (INDEPENDENT_AMBULATORY_CARE_PROVIDER_SITE_OTHER): Payer: BC Managed Care – PPO | Admitting: Neurology

## 2021-02-06 ENCOUNTER — Encounter: Payer: Self-pay | Admitting: Neurology

## 2021-02-06 VITALS — BP 140/82 | HR 83 | Ht 61.0 in | Wt 182.8 lb

## 2021-02-06 DIAGNOSIS — G43109 Migraine with aura, not intractable, without status migrainosus: Secondary | ICD-10-CM

## 2021-02-06 MED ORDER — TOPIRAMATE 50 MG PO TABS: 50.0000 mg | ORAL_TABLET | Freq: Every day | ORAL | 1 refills | Status: DC

## 2021-02-06 NOTE — Patient Instructions (Signed)
  Topiramate 50mg  at bedtime Take rizatriptan 10mg  at earliest onset of headache.  May repeat dose once in 2 hours if needed.  Maximum 2 tablets in 24 hours. Limit use of pain relievers to no more than 2 days out of the week.  These medications include acetaminophen, NSAIDs (ibuprofen/Advil/Motrin, naproxen/Aleve, triptans (Imitrex/sumatriptan), Excedrin, and narcotics.  This will help reduce risk of rebound headaches. Be aware of common food triggers:  - Caffeine:  coffee, black tea, cola, Mt. Dew  - Chocolate  - Dairy:  aged cheeses (brie, blue, cheddar, gouda, Wiggins, provolone, [place], Swiss, etc), chocolate milk, buttermilk, sour cream, limit eggs and yogurt  - Nuts, peanut butter  - Alcohol  - Cereals/grains:  FRESH breads (fresh bagels, sourdough, doughnuts), yeast productions  - Processed/canned/aged/cured meats (pre-packaged deli meats, hotdogs)  - MSG/glutamate:  soy sauce, flavor enhancer, pickled/preserved/marinated foods  - Sweeteners:  aspartame (Equal, Nutrasweet).  Sugar and Splenda are okay  - Vegetables:  legumes (lima beans, lentils, snow peas, fava beans, pinto peans, peas, garbanzo beans), sauerkraut, onions, olives, pickles  - Fruit:  avocados, bananas, citrus fruit (orange, lemon, grapefruit), mango  - Other:  Frozen meals, macaroni and cheese Routine exercise Stay adequately hydrated (aim for 64 oz water daily) Keep headache diary Maintain proper stress management Maintain proper sleep hygiene Do not skip meals Consider supplements:  magnesium citrate 400mg  daily, riboflavin 400mg  daily, coenzyme Q10 100mg  three times daily.

## 2021-04-24 ENCOUNTER — Ambulatory Visit (INDEPENDENT_AMBULATORY_CARE_PROVIDER_SITE_OTHER): Payer: BC Managed Care – PPO | Admitting: Family Medicine

## 2021-04-24 ENCOUNTER — Ambulatory Visit: Payer: Self-pay

## 2021-04-24 ENCOUNTER — Other Ambulatory Visit: Payer: Self-pay

## 2021-04-24 VITALS — BP 141/78 | HR 90 | Temp 98.5°F | Wt 182.0 lb

## 2021-04-24 DIAGNOSIS — J40 Bronchitis, not specified as acute or chronic: Secondary | ICD-10-CM | POA: Diagnosis not present

## 2021-04-24 MED ORDER — HYDROCODONE BIT-HOMATROP MBR 5-1.5 MG/5ML PO SOLN: 5.0000 mL | ORAL | 0 refills | Status: AC | PRN

## 2021-04-24 MED ORDER — AZITHROMYCIN 250 MG PO TABS: ORAL_TABLET | ORAL | 0 refills | Status: AC

## 2021-04-24 NOTE — Progress Notes (Signed)
Established patient visit   Patient: Denise Stephens   DOB: [birth year]   50 y.o. Female  MRN: [id number] Visit Date: 04/24/2021  Today's healthcare provider: Mila Merry, MD   Chief Complaint  Patient presents with   Cough   Subjective    Cough This is a new problem. Episode onset: Started about three weeks ago. The problem has been unchanged. The cough is Productive of sputum. Associated symptoms include a fever (Fever in the beginning, but has resolved.), a sore throat and shortness of breath. Pertinent negatives include no chills, ear congestion, ear pain, headaches, heartburn, hemoptysis, nasal congestion, postnasal drip, rhinorrhea or wheezing. She has tried OTC cough suppressant and steroid inhaler for the symptoms. The treatment provided mild relief.     Medications: Outpatient Medications Prior to Visit  Medication Sig   albuterol (VENTOLIN HFA) 108 (90 Base) MCG/ACT inhaler Inhale 1-2 puffs into the lungs every 6 (six) hours as needed for shortness of breath.   Ferrous Sulfate (IRON) 28 MG TABS Take by mouth.   naproxen (NAPROSYN) 500 MG tablet Take 1 tablet (500 mg total) by mouth 2 (two) times daily with a meal. As needed for pain   rizatriptan (MAXALT) 10 MG tablet Take 1 tablet earliest onset of migraine.  May repeat in 2 hours if needed.  Maximum 2 tablets in 24 hours.   topiramate (TOPAMAX) 50 MG tablet Take 1 tablet (50 mg total) by mouth at bedtime.   ALPRAZolam (XANAX) 0.5 MG tablet Take 1 tablet (0.5 mg total) by mouth at bedtime as needed for sleep. (Patient not taking: No sig reported)   fluticasone furoate-vilanterol (BREO ELLIPTA) 200-25 MCG/INH AEPB Inhale 1 puff into the lungs daily. (Patient not taking: No sig reported)   nystatin cream (MYCOSTATIN) Apply 1 application topically 2 (two) times daily. (Patient not taking: No sig reported)   tranexamic acid (LYSTEDA) 650 MG TABS tablet Take 2 tablets (1,300 mg total) by mouth 3 (three) times  daily. Take during menses for a maximum of five days (Patient not taking: No sig reported)   No facility-administered medications prior to visit.    Review of Systems  Constitutional:  Positive for fatigue and fever (Fever in the beginning, but has resolved.). Negative for activity change, appetite change, chills, diaphoresis and unexpected weight change.  HENT:  Positive for congestion and sore throat. Negative for ear discharge, ear pain, postnasal drip, rhinorrhea, sinus pressure, sinus pain, tinnitus and trouble swallowing.   Eyes: Negative.   Respiratory:  Positive for cough and shortness of breath. Negative for apnea, hemoptysis, choking, chest tightness, wheezing and stridor.   Gastrointestinal: Negative.  Negative for heartburn.  Neurological:  Negative for dizziness, light-headedness and headaches.      Objective    BP (!) 141/78 (BP Location: Left Arm, Patient Position: Sitting, Cuff Size: Large)   Pulse 90   Temp 98.5 F (36.9 C) (Oral)   Wt 182 lb (82.6 kg)   SpO2 100%   BMI 34.39 kg/m    Physical Exam   General: Appearance:    Obese female in no acute distress  Eyes:    PERRL, conjunctiva/corneas clear, EOM's intact       Lungs:     Clear to auscultation bilaterally, respirations unlabored  Heart:    Normal heart rate. Normal rhythm. No murmurs, rubs, or gallops.    MS:   All extremities are intact.    Neurologic:   Awake, alert, oriented x 3. No apparent  focal neurological defect.         Assessment & Plan     1. Bronchitis  - azithromycin (ZITHROMAX) 250 MG tablet; Take 2 tablets on day 1, then 1 tablet daily on days 2 through 5  Dispense: 6 tablet; Refill: 0  - HYDROcodone bit-homatropine (HYCODAN) 5-1.5 MG/5ML syrup; Take 5 mLs by mouth every 4 (four) hours as needed for up to 10 days for cough.  Dispense: 120 mL; Refill: 0   Call if symptoms change or if not rapidly improving.        The entirety of the information documented in the History of  Present Illness, Review of Systems and Physical Exam were personally obtained by me. Portions of this information were initially documented by the CMA and reviewed by me for thoroughness and accuracy.     Mila Merry, MD  [facility] [phone] (phone) [phone] (fax)  [facility]

## 2021-04-24 NOTE — Telephone Encounter (Signed)
Pt. Reports she has had s cough "for several weeks. This weekend I pulled a muscle coughing. It hurts at my right ribs under my breast." Hurts with movement, sharp pains. No pain now. Concerned she could have pneumonia. Appointment made for today per [place] in the practice.     Reason for Disposition  [1] Chest pain lasts > 5 minutes AND [2] occurred > 3 days ago (72 hours) AND [3] NO chest pain or cardiac symptoms now  Answer Assessment - Initial Assessment Questions 1. LOCATION: "Where does it hurt?"       Right ribs 2. RADIATION: "Does the pain go anywhere else?" (e.g., into neck, jaw, arms, back)     No 3. ONSET: "When did the chest pain begin?" (Minutes, hours or days)      2 days ago 4. PATTERN "Does the pain come and go, or has it been constant since it started?"  "Does it get worse with exertion?"      Comes and goes 5. DURATION: "How long does it last" (e.g., seconds, minutes, hours)     Seconds 6. SEVERITY: "How bad is the pain?"  (e.g., Scale 1-10; mild, moderate, or severe)    - MILD (1-3): doesn't interfere with normal activities     - MODERATE (4-7): interferes with normal activities or awakens from sleep    - SEVERE (8-10): excruciating pain, unable to do any normal activities       Now - no pain 7. CARDIAC RISK FACTORS: "Do you have any history of heart problems or risk factors for heart disease?" (e.g., angina, prior heart attack; diabetes, high blood pressure, high cholesterol, smoker, or strong family history of heart disease)     No 8. PULMONARY RISK FACTORS: "Do you have any history of lung disease?"  (e.g., blood clots in lung, asthma, emphysema, birth control pills)     History pneumonia 9. CAUSE: "What do you think is causing the chest pain?"     Coughing 10. OTHER SYMPTOMS: "Do you have any other symptoms?" (e.g., dizziness, nausea, vomiting, sweating, fever, difficulty breathing, cough)       Cough , shortness of breath when leaning over 11. PREGNANCY: "Is  there any chance you are pregnant?" "When was your last menstrual period?"       No  Protocols used: Chest Pain-A-AH

## 2021-05-03 ENCOUNTER — Ambulatory Visit: Payer: Self-pay | Admitting: *Deleted

## 2021-05-03 NOTE — Telephone Encounter (Signed)
Called patient to review symptoms . C/o "still coughing' but feeling better than when last seen by PCP 04/24/21. Reports completed antibiotics last Friday but can not get rid of cough. Productive cough remains but phlegm now clear. Denies shortness of breath , no chest pain or difficulty breathing. Coughing spells during the day . Taking cough medication as prescribed at night and is helping to sleep with minimal coughing. Please advise . Patient is requesting a call back for additional medication or recommendations from PCP. Care advise given. Patient verbalized understanding of care advise and to call back or go to [facility] or ED if symptoms worsen.

## 2021-05-03 NOTE — Telephone Encounter (Signed)
Pt called stating that she has recently just finished the antibiotic, anthramycin, she states that she is feeling better. She states that she does still have a cough and phlegm. She states that she is still using the hydrocodone cough medicine and it is getting some better, but seems to be getting worse now. She states that she also has been using the albuterol inhaler. Please advise.  Reason for Disposition  Cough lasts > 3 weeks  Answer Assessment - Initial Assessment Questions 1. ONSET: "When did the cough begin?"      Prior to 04/24/21 2. SEVERITY: "How bad is the cough today?"      Chronic and has coughing spells  3. SPUTUM: "Describe the color of your sputum" (none, dry cough; clear, white, yellow, green)     Clear  4. HEMOPTYSIS: "Are you coughing up any blood?" If so ask: "How much?" (flecks, streaks, tablespoons, etc.)     no 5. DIFFICULTY BREATHING: "Are you having difficulty breathing?" If Yes, ask: "How bad is it?" (e.g., mild, moderate, severe)    - MILD: No SOB at rest, mild SOB with walking, speaks normally in sentences, can lie down, no retractions, pulse < 100.    - MODERATE: SOB at rest, SOB with minimal exertion and prefers to sit, cannot lie down flat, speaks in phrases, mild retractions, audible wheezing, pulse 100-120.    - SEVERE: Very SOB at rest, speaks in single words, struggling to breathe, sitting hunched forward, retractions, pulse > 120      No  6. FEVER: "Do you have a fever?" If Yes, ask: "What is your temperature, how was it measured, and when did it start?"     na 7. CARDIAC HISTORY: "Do you have any history of heart disease?" (e.g., heart attack, congestive heart failure)      na 8. LUNG HISTORY: "Do you have any history of lung disease?"  (e.g., pulmonary embolus, asthma, emphysema)     na 9. PE RISK FACTORS: "Do you have a history of blood clots?" (or: recent major surgery, recent prolonged travel, bedridden)     na 10. OTHER SYMPTOMS: "Do you have any  other symptoms?" (e.g., runny nose, wheezing, chest pain)       Coughing spells  11. PREGNANCY: "Is there any chance you are pregnant?" "When was your last menstrual period?"       na 12. TRAVEL: "Have you traveled out of the country in the last month?" (e.g., travel history, exposures)       na  Protocols used: Cough - Chronic-A-AH

## 2021-05-04 NOTE — Telephone Encounter (Signed)
Pt calling to see if anything was called in for her. Message from Dr. Sherrie Mustache read to pt. Verbalizes understanding and states she will follow recommendation.

## 2021-05-04 NOTE — Telephone Encounter (Signed)
Cough typically lingers for 2 weeks after infection is gone. May take up to 6weeks. Can take OTC Delsym or Mucinex DM

## 2021-05-08 ENCOUNTER — Ambulatory Visit
Admission: EM | Admit: 2021-05-08 | Discharge: 2021-05-08 | Disposition: A | Discharge status: Home / Self Care | Payer: BC Managed Care – PPO | Attending: Physician Assistant | Admitting: Physician Assistant

## 2021-05-08 ENCOUNTER — Encounter: Payer: Self-pay | Admitting: Emergency Medicine

## 2021-05-08 ENCOUNTER — Ambulatory Visit (INDEPENDENT_AMBULATORY_CARE_PROVIDER_SITE_OTHER): Payer: BC Managed Care – PPO

## 2021-05-08 ENCOUNTER — Ambulatory Visit: Payer: Self-pay | Admitting: *Deleted

## 2021-05-08 ENCOUNTER — Other Ambulatory Visit: Payer: Self-pay

## 2021-05-08 DIAGNOSIS — R051 Acute cough: Secondary | ICD-10-CM | POA: Diagnosis not present

## 2021-05-08 DIAGNOSIS — J40 Bronchitis, not specified as acute or chronic: Secondary | ICD-10-CM

## 2021-05-08 DIAGNOSIS — J189 Pneumonia, unspecified organism: Secondary | ICD-10-CM | POA: Diagnosis not present

## 2021-05-08 DIAGNOSIS — R059 Cough, unspecified: Secondary | ICD-10-CM

## 2021-05-08 DIAGNOSIS — R918 Other nonspecific abnormal finding of lung field: Secondary | ICD-10-CM | POA: Diagnosis not present

## 2021-05-08 HISTORY — DX: Bronchitis, not specified as acute or chronic: J40

## 2021-05-08 MED ORDER — LEVOFLOXACIN 750 MG PO TABS: 750.0000 mg | ORAL_TABLET | Freq: Every day | ORAL | 0 refills | Status: AC

## 2021-05-08 NOTE — Telephone Encounter (Signed)
Reason for Disposition  Wheezing is present  Answer Assessment - Initial Assessment Questions 1. ONSET: "When did the cough begin?"      11/7 2. SEVERITY: "How bad is the cough today?"      Patient is coughing still- with wheezing 3. SPUTUM: "Describe the color of your sputum" (none, dry cough; clear, white, yellow, green)     white 4. HEMOPTYSIS: "Are you coughing up any blood?" If so ask: "How much?" (flecks, streaks, tablespoons, etc.)     no 5. DIFFICULTY BREATHING: "Are you having difficulty breathing?" If Yes, ask: "How bad is it?" (e.g., mild, moderate, severe)    - MILD: No SOB at rest, mild SOB with walking, speaks normally in sentences, can lie down, no retractions, pulse < 100.    - MODERATE: SOB at rest, SOB with minimal exertion and prefers to sit, cannot lie down flat, speaks in phrases, mild retractions, audible wheezing, pulse 100-120.    - SEVERE: Very SOB at rest, speaks in single words, struggling to breathe, sitting hunched forward, retractions, pulse > 120      Wheezing, SOB with cough- worse at night- inhaler helps some 6. FEVER: "Do you have a fever?" If Yes, ask: "What is your temperature, how was it measured, and when did it start?"     no 7. CARDIAC HISTORY: "Do you have any history of heart disease?" (e.g., heart attack, congestive heart failure)      no 8. LUNG HISTORY: "Do you have any history of lung disease?"  (e.g., pulmonary embolus, asthma, emphysema)     Hx pneumonia, bronchitis, COVID pneumonia 9. PE RISK FACTORS: "Do you have a history of blood clots?" (or: recent major surgery, recent prolonged travel, bedridden)     no 10. OTHER SYMPTOMS: "Do you have any other symptoms?" (e.g., runny nose, wheezing, chest pain)       wheezing 11. PREGNANCY: "Is there any chance you are pregnant?" "When was your last menstrual period?"       na 12. TRAVEL: "Have you traveled out of the country in the last month?" (e.g., travel history, exposures)        no  Protocols used: Cough - Acute Productive-A-AH

## 2021-05-08 NOTE — Discharge Instructions (Signed)
-  As we discussed, there is an irregularity on your chest x-ray that does require further imaging.  Radiologist recommends a CT scan.  You should follow-up with your primary care provider so that they can order this for you.  I do not think you need it ordered emergently since you are stable.  However, as we discussed if you were to develop a fever, chest pain, pain on breathing, severe back pain, shortness of breath, severe worsening cough or specially coughing up blood or weakness you should go to emergency department to have it done emergently. - I suspect the irregularity could be related to lower respiratory infection since you have been sick.  I have sent a strong antibiotic to pharmacy.  Hopefully this helps.  Continue with the over-the-counter cough medication, increasing rest and fluids.  Make sure to follow-up with your PCP.

## 2021-05-08 NOTE — ED Triage Notes (Signed)
Pt presents today with c/o cough. She was seen by PCP on 04/24/21 and dx with bronchitis. She completed Z-pak and Hydrocodone Cough syrup. She reports feeling better for a short time, but reports now feeling worse

## 2021-05-08 NOTE — Telephone Encounter (Signed)
Patient is calling office- patient states she is doing better- except the cough- it is productive and she is coughing up white sputum- but patient states she has started wheezing and this worries her due to her pneumonia history. Patient was treated 11/7- antibiotic and Rx cough medication- patient states she improved except for cough. Patient states on 11/16- she was advised OTC Mucinex DM and cough medication- she is using now. Patient states she just can't get the cough to go away- worse at night. Patient has been using the inhaler she has which helps some. Patient advised no appointment open- will send message for next steps- patient advised she may be instructed to go to UC for evaluation of cough.

## 2021-05-08 NOTE — ED Provider Notes (Signed)
[facility]    CSN: [id number] Arrival date & time: 05/08/21  1342      History   Chief Complaint Chief Complaint  Patient presents with   Cough    HPI Denise Stephens is a 50 y.o. female presenting for 1 month history of cough.  Cough is occasionally productive.  Patient also reports some congestion.  She states that at the onset of her symptoms she did have a fever for about 3 days and sore throat.  She has no longer had any fevers.  She had an appointment with her PCP on 04/24/2021 and was diagnosed with bronchitis.  Patient was treated with a Z-Pak and hydrocodone cough syrup.  She says that she felt better after couple days of taking it but then her symptoms returned and have not improved.  Patient reports concern for pneumonia.  She has had COVID-pneumonia at the end of last year.  Does not report any chest pain, pain on breathing, shortness of breath, hemoptysis.  Patient is not a smoker but has been exposed to secondhand smoke in her life.  No known COVID exposure.  No other complaints.  HPI  Past Medical History:  Diagnosis Date   Bronchitis    Lobar pneumonia (HCC) 05/04/2015    Patient Active Problem List   Diagnosis Date Noted   Bronchitis 05/08/2021   COVID-19 05/25/2020   IBS (irritable bowel syndrome) 05/04/2015   Excess, menstruation 05/04/2015   Vitamin D deficiency 05/04/2015    Past Surgical History:  Procedure Laterality Date   CESAREAN SECTION  200, 2002, 2009   x3   TUBAL LIGATION      OB History     Gravida  3   Para  3   Term  3   Preterm      AB      Living  3      SAB      IAB      Ectopic      Multiple      Live Births  3            Home Medications    Prior to Admission medications   Medication Sig Start Date End Date Taking? Authorizing Provider  albuterol (VENTOLIN HFA) 108 (90 Base) MCG/ACT inhaler Inhale 1-2 puffs into the lungs every 6 (six) hours as needed for shortness of breath.  05/25/20  Yes Malva Limes, MD  levofloxacin (LEVAQUIN) 750 MG tablet Take 1 tablet (750 mg total) by mouth daily for 5 days. 05/08/21 05/13/21 Yes Shirlee Latch, PA-C  Ferrous Sulfate (IRON) 28 MG TABS Take by mouth.    [provider]  naproxen (NAPROSYN) 500 MG tablet Take 1 tablet (500 mg total) by mouth 2 (two) times daily with a meal. As needed for pain 03/25/19   Schuman, Christanna R, MD  rizatriptan (MAXALT) 10 MG tablet Take 1 tablet earliest onset of migraine.  May repeat in 2 hours if needed.  Maximum 2 tablets in 24 hours. 12/08/19   Drema Dallas, DO  topiramate (TOPAMAX) 50 MG tablet Take 1 tablet (50 mg total) by mouth at bedtime. 02/06/21   Drema Dallas, DO    Family History Family History  Problem Relation Age of Onset   Heart disease Father    Bone cancer Father 46   Lung cancer Father 3   COPD Father    Thyroid disease Sister    Colon cancer Paternal Grandfather 16  Social History Social History   Tobacco Use   Smoking status: Never   Smokeless tobacco: Never  Vaping Use   Vaping Use: Never used  Substance Use Topics   Alcohol use: No    Alcohol/week: 0.0 standard drinks   Drug use: No     Allergies   Sulfa antibiotics   Review of Systems Review of Systems  Constitutional:  Negative for chills, diaphoresis, fatigue and fever.  HENT:  Positive for congestion and sore throat (seconday to cough). Negative for ear pain, rhinorrhea, sinus pressure and sinus pain.   Respiratory:  Positive for cough and wheezing. Negative for shortness of breath.   Cardiovascular:  Negative for chest pain.  Gastrointestinal:  Negative for abdominal pain, nausea and vomiting.  Musculoskeletal:  Negative for arthralgias and myalgias.  Skin:  Negative for rash.  Neurological:  Negative for weakness and headaches.  Hematological:  Negative for adenopathy.    Physical Exam Triage Vital Signs ED Triage Vitals  Enc Vitals Group     BP 05/08/21 1445 (!)  149/84     Pulse Rate 05/08/21 1445 77     Resp 05/08/21 1445 20     Temp 05/08/21 1445 98.3 F (36.8 C)     Temp Source 05/08/21 1445 Oral     SpO2 05/08/21 1445 97 %     Weight --      Height --      Head Circumference --      Peak Flow --      Pain Score 05/08/21 1449 0     Pain Loc --      Pain Edu? --      Excl. in GC? --    No data found.  Updated Vital Signs BP (!) 149/84 (BP Location: Left Arm)   Pulse 77   Temp 98.3 F (36.8 C) (Oral)   Resp 20   LMP 04/26/2021 (Approximate)   SpO2 97%   Physical Exam Vitals and nursing note reviewed.  Constitutional:      General: She is not in acute distress.    Appearance: Normal appearance. She is not ill-appearing or toxic-appearing.  HENT:     Head: Normocephalic and atraumatic.     Nose: Nose normal.     Mouth/Throat:     Mouth: Mucous membranes are moist.     Pharynx: Oropharynx is clear. Posterior oropharyngeal erythema (mild injection of posterior pharynx) present.  Eyes:     General: No scleral icterus.       Right eye: No discharge.        Left eye: No discharge.     Conjunctiva/sclera: Conjunctivae normal.  Cardiovascular:     Rate and Rhythm: Normal rate and regular rhythm.     Heart sounds: Normal heart sounds.  Pulmonary:     Effort: Pulmonary effort is normal. No respiratory distress.     Breath sounds: Normal breath sounds.  Musculoskeletal:     Cervical back: Neck supple.  Skin:    General: Skin is dry.  Neurological:     General: No focal deficit present.     Mental Status: She is alert. Mental status is at baseline.     Motor: No weakness.     Gait: Gait normal.  Psychiatric:        Mood and Affect: Mood normal.        Behavior: Behavior normal.        Thought Content: Thought content normal.     UC Treatments /  Results  Labs (all labs ordered are listed, but only abnormal results are displayed) Labs Reviewed - No data to display  EKG   Radiology DG Chest 2 View  Result Date:  05/08/2021 CLINICAL DATA:  Cough EXAM: CHEST - 2 VIEW COMPARISON:  December 2021 FINDINGS: There is a new 2.9 cm masslike density overlying the lower right lung. This is not seen on the lateral view and may be pleural or chest wall in location. Lungs are otherwise clear. Cardiomediastinal contours are within normal limits. No acute osseous abnormality. IMPRESSION: Chest CT is recommended for a 2.9 cm masslike density overlying the lower right lung. This is not seen on lateral view and may be pleural or chest wall in location. Electronically Signed   By: Guadlupe Spanish M.D.   On: 05/08/2021 16:15    Procedures Procedures (including critical care time)  Medications Ordered in UC Medications - No data to display  Initial Impression / Assessment and Plan / UC Course  I have reviewed the triage vital signs and the nursing notes.  Pertinent labs & imaging results that were available during my care of the patient were reviewed by me and considered in my medical decision making (see chart for details).   50 year old female presenting for 1 month history of cough that is occasionally productive.  Has been treated with azithromycin and hydrocodone cough syrup.  Reports initial improvement but then return of symptoms.  No associated fever, chest pain or breathing difficulty.  Reports an occasional wheeze.  Fever at onset and patient states that her entire household was sick with similar symptoms but got better.  Patient currently afebrile and is overall well-appearing.  Exam significant for mild posterior pharyngeal injection.  Chest clear to auscultation heart regular rate and rhythm.  Chest x-ray obtained today shows "2.9 cm masslike density overlying the lower right along."  I discussed this result with patient.  CT of chest is recommended by radiologist to look at this area further.  Radiologist states that it is not seen on the lateral view and could be pleural or chest wall in location.  I suspect given  her recent illness and initial improvement with azithromycin this could be infectious in nature.  We will treat with Levaquin at this time and advised her to follow-up with her PCP.  Advised to call PCP tomorrow to have the CT scheduled.  Discussed possible etiologies with patient including infectious, inflammatory or malignant.  Could be pleural effusion or pneumonia.  Thoroughly reviewed ED precautions with patient advised to go to the emergency department sooner if she develops a fever, chest pain, pleuritic pain, shortness of breath, hemoptysis, worsening cough, etc.  Advise increasing rest and fluids and continuing OTC cough medication.  Advised following up with [place] as needed.   Final Clinical Impressions(s) / UC Diagnoses   Final diagnoses:  Community acquired pneumonia of right lower lobe of lung  Acute cough  Right lower lobe lung mass     Discharge Instructions      -As we discussed, there is an irregularity on your chest x-ray that does require further imaging.  Radiologist recommends a CT scan.  You should follow-up with your primary care provider so that they can order this for you.  I do not think you need it ordered emergently since you are stable.  However, as we discussed if you were to develop a fever, chest pain, pain on breathing, severe back pain, shortness of breath, severe worsening cough or specially coughing  up blood or weakness you should go to emergency department to have it done emergently. - I suspect the irregularity could be related to lower respiratory infection since you have been sick.  I have sent a strong antibiotic to pharmacy.  Hopefully this helps.  Continue with the over-the-counter cough medication, increasing rest and fluids.  Make sure to follow-up with your PCP.   ED Prescriptions     Medication Sig Dispense Auth. Provider   levofloxacin (LEVAQUIN) 750 MG tablet Take 1 tablet (750 mg total) by mouth daily for 5 days. 5 tablet Gareth Morgan       PDMP not reviewed this encounter.   Shirlee Latch, PA-C 05/08/21 1712

## 2021-05-09 ENCOUNTER — Telehealth: Payer: Self-pay

## 2021-05-09 DIAGNOSIS — R918 Other nonspecific abnormal finding of lung field: Secondary | ICD-10-CM

## 2021-05-09 NOTE — Telephone Encounter (Signed)
Order has already been placed, she should a call from CT in the next couple of days.

## 2021-05-09 NOTE — Addendum Note (Signed)
Addended by: Malva Limes on: 05/09/2021 04:47 PM   Modules accepted: Orders

## 2021-05-09 NOTE — Telephone Encounter (Signed)
Copied from CRM [id number]. Topic: General - Other >> May 09, 2021  8:57 AM Herby Abraham C wrote: Reason for CRM: pt called in for assistance. Pt says that she went to UC as suggested by NT. Pt says that she was Dx with pneumonia. Pt says that she was told to request a CT scan of her lungs from her PCP. Pt would like further assistance with this  CB: [phone] -

## 2021-05-09 NOTE — Telephone Encounter (Signed)
Pt called back/ she stated that she hasnt heard anything about the CT scan / she asked if someone from the office will be calling her today / please advise

## 2021-05-10 NOTE — Telephone Encounter (Signed)
Patient advised.

## 2021-05-24 ENCOUNTER — Ambulatory Visit: Payer: Self-pay

## 2021-05-24 ENCOUNTER — Other Ambulatory Visit: Payer: Self-pay | Admitting: Family Medicine

## 2021-05-24 ENCOUNTER — Telehealth: Payer: Self-pay

## 2021-05-24 ENCOUNTER — Ambulatory Visit
Admission: RE | Admit: 2021-05-24 | Discharge: 2021-05-24 | Disposition: A | Discharge status: Home / Self Care | Payer: BC Managed Care – PPO | Source: Ambulatory Visit | Attending: Family Medicine | Admitting: Family Medicine

## 2021-05-24 ENCOUNTER — Other Ambulatory Visit: Payer: Self-pay

## 2021-05-24 DIAGNOSIS — D383 Neoplasm of uncertain behavior of mediastinum: Secondary | ICD-10-CM | POA: Diagnosis not present

## 2021-05-24 DIAGNOSIS — R918 Other nonspecific abnormal finding of lung field: Secondary | ICD-10-CM | POA: Insufficient documentation

## 2021-05-24 DIAGNOSIS — R911 Solitary pulmonary nodule: Secondary | ICD-10-CM

## 2021-05-24 NOTE — Telephone Encounter (Signed)
Rubi from [facility] calling to give STAT results from CT Chest done today. Will call office to let Dr Sherrie Mustache know of results.   IMPRESSION: 2.7 x 2.2 cm rounded mass noted laterally and posteriorly in right middle lobe which corresponds to abnormality seen on prior radiograph. This is concerning for possible neoplasm, and further evaluation with PET scan is recommended

## 2021-05-24 NOTE — Telephone Encounter (Signed)
Patient advised and agrees to referral. Please schedule appointment.  

## 2021-05-24 NOTE — Telephone Encounter (Signed)
-----   Message from Malva Limes, MD sent at 05/24/2021  1:52 PM EST ----- CT shows 2.7cm in left lung that needs further evaluation. Have place dreferral to pulmonary nodule clinic.

## 2021-05-24 NOTE — Telephone Encounter (Signed)
See result note. Patient to be referred to pulmonary nodule clinic

## 2021-05-24 NOTE — Telephone Encounter (Signed)
Patient advised.

## 2021-05-28 ENCOUNTER — Other Ambulatory Visit: Payer: Self-pay | Admitting: Family Medicine

## 2021-05-28 DIAGNOSIS — R059 Cough, unspecified: Secondary | ICD-10-CM

## 2021-05-28 DIAGNOSIS — R0602 Shortness of breath: Secondary | ICD-10-CM

## 2021-05-28 NOTE — Telephone Encounter (Signed)
Requested Prescriptions  Pending Prescriptions Disp Refills  . albuterol (VENTOLIN HFA) 108 (90 Base) MCG/ACT inhaler [Pharmacy Med Name: ALBUTEROL HFA (PROVENTIL) INH] 6.7 each 0    Sig: INHALE 1-2 PUFFS INTO THE LUNGS EVERY 6 (SIX) HOURS AS NEEDED FOR SHORTNESS OF BREATH.     Pulmonology:  Beta Agonists Failed - 05/28/2021 10:12 AM      Failed - One inhaler should last at least one month. If the patient is requesting refills earlier, contact the patient to check for uncontrolled symptoms.      Passed - Valid encounter within last 12 months    Recent Outpatient Visits          1 month ago Bronchitis   [facility] Malva Limes, MD   11 months ago Bronchitis   [facility], Jodell Cipro, PA-C   1 year ago COVID-19   [facility] Sherrie Mustache, Demetrios Isaacs, MD   2 years ago Pharyngitis, unspecified etiology   [facility] Malva Limes, MD   3 years ago Acute sinusitis, recurrence not specified, unspecified location   [facility] Malva Limes, MD             '

## 2021-05-29 ENCOUNTER — Other Ambulatory Visit: Payer: Self-pay

## 2021-05-29 ENCOUNTER — Ambulatory Visit (INDEPENDENT_AMBULATORY_CARE_PROVIDER_SITE_OTHER): Payer: BC Managed Care – PPO | Admitting: Pulmonary Disease

## 2021-05-29 ENCOUNTER — Telehealth: Payer: Self-pay

## 2021-05-29 ENCOUNTER — Encounter: Payer: Self-pay | Admitting: Pulmonary Disease

## 2021-05-29 VITALS — BP 130/84 | HR 76 | Temp 97.1°F | Ht 61.0 in | Wt 181.0 lb

## 2021-05-29 DIAGNOSIS — R911 Solitary pulmonary nodule: Secondary | ICD-10-CM | POA: Diagnosis not present

## 2021-05-29 NOTE — H&P (View-Only) (Signed)
 Subjective:    Patient ID: Denise Stephens, female    DOB: 04/07/1971, 49 y.o.   MRN: [id number] Chief Complaint  Patient presents with   Consult    Lung Nodule-    HPI Denise Stephens is a 49-year-old lifelong never smoker (albeit significant secondhand smoking exposure) who presents for evaluation of an abnormal CT chest.  She is kindly referred by Dr. Donald Fisher.  She presents today with her husband.  The patient states that evaluation with CT chest was triggered by evaluation of cough of 1 month duration.  The cough is for the most part nonproductive.  She has not had any hemoptysis.  She was initially seen by primary care on 24 April 2021 after a history of 3 to 4 days of fever and sore throat.  There have been sick contacts at home.  On evaluation on 7 November she was diagnosed with bronchitis and was treated with a Z-Pak and hydrocodone cough syrup.  She felt better after a couple of days however her symptoms recurred.  Because she has had pneumonia in the past she was concerned that she had pneumonia again.  Was evaluated at the [facility] on 21 November where a chest x-ray was performed and this showed a 2.9 cm masslike density overlying the lower right lung.  This was followed by a CT chest on 7 December which showed that 2.7 x 2.2 cm rounded mass laterally and posteriorly in the right middle lobe which corresponded to the abnormality seen on the radiograph.  Additional imaging with PET scan was recommended.  Patient does not endorse any current fevers, chills or sweats.  Chest pain.  No wheezing.  No shortness of breath.  She has not had any weight loss or anorexia.  Her cough has resolved from prior.  She had COVID-19 at the end of last year.  Review of films from a year prior did not show the abnormality in question.  Review of Systems A 10 point review of systems was performed and it is as noted above otherwise negative.  Past Medical History:  Diagnosis  Date   Bronchitis    Lobar pneumonia (HCC) 05/04/2015   Past Surgical History:  Procedure Laterality Date   CESAREAN SECTION  200, 2002, 2009   x3   TUBAL LIGATION     Family History  Problem Relation Age of Onset   Heart disease Father    Bone cancer Father 73   Lung cancer Father 73   COPD Father    Thyroid disease Sister    Colon cancer Paternal Grandfather 75   Social History   Tobacco Use   Smoking status: Never   Smokeless tobacco: Never  Substance Use Topics   Alcohol use: No    Alcohol/week: 0.0 standard drinks   Allergies  Allergen Reactions   Sulfa Antibiotics    Current Meds  Medication Sig   albuterol (VENTOLIN HFA) 108 (90 Base) MCG/ACT inhaler INHALE 1-2 PUFFS INTO THE LUNGS EVERY 6 (SIX) HOURS AS NEEDED FOR SHORTNESS OF BREATH.   Ferrous Sulfate (IRON) 28 MG TABS Take by mouth.   naproxen (NAPROSYN) 500 MG tablet Take 1 tablet (500 mg total) by mouth 2 (two) times daily with a meal. As needed for pain   rizatriptan (MAXALT) 10 MG tablet Take 1 tablet earliest onset of migraine.  May repeat in 2 hours if needed.  Maximum 2 tablets in 24 hours.   topiramate (TOPAMAX) 50 MG tablet Take 1 tablet (50   mg total) by mouth at bedtime.   Immunization History  Administered Date(s) Administered   Influenza,inj,Quad PF,6+ Mos 03/04/2013   Tdap 03/04/2013       Objective:   Physical Exam BP 130/84 (BP Location: Left Arm, Patient Position: Sitting, Cuff Size: Normal)   Pulse 76   Temp (!) 97.1 F (36.2 C) (Oral)   Ht 5' 1" (1.549 m)   Wt 181 lb (82.1 kg)   SpO2 97%   BMI 34.20 kg/m  GENERAL: Obese woman, no acute distress, fully ambulatory, no conversational dyspnea. HEAD: Normocephalic, atraumatic.  EYES: Pupils equal, round, reactive to light.  No scleral icterus.  MOUTH: Nose/mouth/throat not examined due to masking requirements for COVID 19. NECK: Supple. No thyromegaly. Trachea midline. No JVD.  No adenopathy. PULMONARY: Good air entry bilaterally.   No adventitious sounds. CARDIOVASCULAR: S1 and S2. Regular rate and rhythm.  No rubs, murmurs or gallops heard. ABDOMEN: Obese, otherwise benign. MUSCULOSKELETAL: No joint deformity, no clubbing, no edema.  NEUROLOGIC: No focal deficit, no gait disturbance, speech is fluent. SKIN: Intact,warm,dry. PSYCH: Mood and behavior normal   Representative image from the CT performed 24 May 2021:     Assessment & Plan:     ICD-10-CM   1. Lung nodule/mass 2.7/2.2 CM on RIGHT  R91.1 NM PET Image Initial (PI) Skull Base To Thigh    CT SUPER D CHEST WO [facility] PILOT   Concerning for malignancy DDX: Mass, Pseudotumor PET/CT [facility] protocol CT Robotic bronchoscopy 28 December     Orders Placed This Encounter  Procedures   NM PET Image Initial (PI) Skull Base To Thigh    Standing Status:   Future    Standing Expiration Date:   05/29/2022    Order Specific Question:   If indicated for the ordered procedure, I authorize the administration of a radiopharmaceutical per Radiology protocol    Answer:   Yes    Order Specific Question:   Is the patient pregnant?    Answer:   No    Order Specific Question:   Preferred imaging location?    Answer:   [facility]   CT SUPER D CHEST WO [facility] PILOT    [facility] Protocol Slice Thickness 0.75 Slice Internal 0.5 Arms Down  Inspiratory image    Standing Status:   Future    Standing Expiration Date:   05/29/2022    Order Specific Question:   Is patient pregnant?    Answer:   No    Order Specific Question:   Preferred imaging location?    Answer:   [facility]   Benefits, limitations and potential complications of the procedure were discussed with the patient/family.  Complications from bronchoscopy are rare and most often minor, but if they occur they may include breathing difficulty, vocal cord spasm, hoarseness, slight fever, vomiting, dizziness, bronchospasm, infection, low blood oxygen, bleeding from biopsy site, or an allergic  reaction to medications.  It is uncommon for patients to experience other more serious complications for example: Collapsed lung requiring chest tube placement, respiratory failure, heart attack and/or cardiac arrhythmia.  Patient agrees to go ahead.  I discussed the findings of the CT with the patient and her husband, they understand need for further imaging.  The patient is anxious and wants definitive procedure.  Further imaging should help delineate nature of the finding.  This could very well be mass versus "pseudotumor".  Will proceed with PET/CT and mapping CT with anticipation of robotic bronchoscopy on 28 December.  The   patient is aware that if repeat imaging shows involution/resolution of the findings and no FDG uptake, the procedure at that time will be canceled and she will be placed on observational protocol.  We will see the patient in follow-up on 03 July 2021.  C. Laura Amarys Sliwinski, MD Advanced Bronchoscopy PCCM [facility]    *This note was dictated using voice recognition software/Dragon.  Despite best efforts to proofread, errors can occur which can change the meaning. Any transcriptional errors that result from this process are unintentional and may not be fully corrected at the time of dictation.  

## 2021-05-29 NOTE — Patient Instructions (Signed)
We will get PET/CT and a CT to help US guide your procedure.  Your procedure has been scheduled for 28 December at 12:30 PM.  You will be receiving instructions of when your tests are going to be as well as when the preoperative evaluation is going to be.  The preoperative evaluation will be done via phone and they will let you know the date for that.  In follow-up 3 to 4 weeks after the procedure.  However, I will stay in contact with you to let you know biopsy results etc.

## 2021-05-29 NOTE — Progress Notes (Addendum)
Subjective:    Patient ID: Denise Stephens, female    DOB: 1970-07-25, 50 y.o.   MRN: [id number] Chief Complaint  Patient presents with   Consult    Lung Nodule-    HPI Denise Stephens is a 50 year old lifelong never smoker (albeit significant secondhand smoking exposure) who presents for evaluation of an abnormal CT chest.  She is kindly referred by Dr. Mila Merry.  She presents today with her husband.  The patient states that evaluation with CT chest was triggered by evaluation of cough of 1 month duration.  The cough is for the most part nonproductive.  She has not had any hemoptysis.  She was initially seen by primary care on 24 April 2021 after a history of 3 to 4 days of fever and sore throat.  There have been sick contacts at home.  On evaluation on 7 November she was diagnosed with bronchitis and was treated with a Z-Pak and hydrocodone cough syrup.  She felt better after a couple of days however her symptoms recurred.  Because she has had pneumonia in the past she was concerned that she had pneumonia again.  Was evaluated at the [facility] on 21 November where a chest x-ray was performed and this showed a 2.9 cm masslike density overlying the lower right lung.  This was followed by a CT chest on 7 December which showed that 2.7 x 2.2 cm rounded mass laterally and posteriorly in the right middle lobe which corresponded to the abnormality seen on the radiograph.  Additional imaging with PET scan was recommended.  Patient does not endorse any current fevers, chills or sweats.  Chest pain.  No wheezing.  No shortness of breath.  She has not had any weight loss or anorexia.  Her cough has resolved from prior.  She had COVID-19 at the end of last year.  Review of films from a year prior did not show the abnormality in question.  Review of Systems A 10 point review of systems was performed and it is as noted above otherwise negative.  Past Medical History:  Diagnosis  Date   Bronchitis    Lobar pneumonia (HCC) 05/04/2015   Past Surgical History:  Procedure Laterality Date   CESAREAN SECTION  200, 2002, 2009   x3   TUBAL LIGATION     Family History  Problem Relation Age of Onset   Heart disease Father    Bone cancer Father 58   Lung cancer Father 33   COPD Father    Thyroid disease Sister    Colon cancer Paternal Grandfather 84   Social History   Tobacco Use   Smoking status: Never   Smokeless tobacco: Never  Substance Use Topics   Alcohol use: No    Alcohol/week: 0.0 standard drinks   Allergies  Allergen Reactions   Sulfa Antibiotics    Current Meds  Medication Sig   albuterol (VENTOLIN HFA) 108 (90 Base) MCG/ACT inhaler INHALE 1-2 PUFFS INTO THE LUNGS EVERY 6 (SIX) HOURS AS NEEDED FOR SHORTNESS OF BREATH.   Ferrous Sulfate (IRON) 28 MG TABS Take by mouth.   naproxen (NAPROSYN) 500 MG tablet Take 1 tablet (500 mg total) by mouth 2 (two) times daily with a meal. As needed for pain   rizatriptan (MAXALT) 10 MG tablet Take 1 tablet earliest onset of migraine.  May repeat in 2 hours if needed.  Maximum 2 tablets in 24 hours.   topiramate (TOPAMAX) 50 MG tablet Take 1 tablet (50  mg total) by mouth at bedtime.   Immunization History  Administered Date(s) Administered   Influenza,inj,Quad PF,6+ Mos 03/04/2013   Tdap 03/04/2013       Objective:   Physical Exam BP 130/84 (BP Location: Left Arm, Patient Position: Sitting, Cuff Size: Normal)   Pulse 76   Temp (!) 97.1 F (36.2 C) (Oral)   Ht 5\' 1"  (1.549 m)   Wt 181 lb (82.1 kg)   SpO2 97%   BMI 34.20 kg/m  GENERAL: Obese woman, no acute distress, fully ambulatory, no conversational dyspnea. HEAD: Normocephalic, atraumatic.  EYES: Pupils equal, round, reactive to light.  No scleral icterus.  MOUTH: Nose/mouth/throat not examined due to masking requirements for COVID 19. NECK: Supple. No thyromegaly. Trachea midline. No JVD.  No adenopathy. PULMONARY: Good air entry bilaterally.   No adventitious sounds. CARDIOVASCULAR: S1 and S2. Regular rate and rhythm.  No rubs, murmurs or gallops heard. ABDOMEN: Obese, otherwise benign. MUSCULOSKELETAL: No joint deformity, no clubbing, no edema.  NEUROLOGIC: No focal deficit, no gait disturbance, speech is fluent. SKIN: Intact,warm,dry. PSYCH: Mood and behavior normal   Representative image from the CT performed 24 May 2021:     Assessment & Plan:     ICD-10-CM   1. Lung nodule/mass 2.7/2.2 CM on RIGHT  R91.1 NM PET Image Initial (PI) Skull Base To Thigh    CT SUPER D CHEST WO [facility] PILOT   Concerning for malignancy DDX: Mass, Pseudotumor PET/CT Monarch protocol CT Robotic bronchoscopy 28 December     Orders Placed This Encounter  Procedures   NM PET Image Initial (PI) Skull Base To Thigh    Standing Status:   Future    Standing Expiration Date:   05/29/2022    Order Specific Question:   If indicated for the ordered procedure, I authorize the administration of a radiopharmaceutical per Radiology protocol    Answer:   Yes    Order Specific Question:   Is the patient pregnant?    Answer:   No    Order Specific Question:   Preferred imaging location?    Answer:   [facility]   CT SUPER D CHEST WO [facility] PILOT    [facility] Protocol Slice Thickness 0.75 Slice Internal 0.5 Arms Down  Inspiratory image    Standing Status:   Future    Standing Expiration Date:   05/29/2022    Order Specific Question:   Is patient pregnant?    Answer:   No    Order Specific Question:   Preferred imaging location?    Answer:   [facility]   Benefits, limitations and potential complications of the procedure were discussed with the patient/family.  Complications from bronchoscopy are rare and most often minor, but if they occur they may include breathing difficulty, vocal cord spasm, hoarseness, slight fever, vomiting, dizziness, bronchospasm, infection, low blood oxygen, bleeding from biopsy site, or an allergic  reaction to medications.  It is uncommon for patients to experience other more serious complications for example: Collapsed lung requiring chest tube placement, respiratory failure, heart attack and/or cardiac arrhythmia.  Patient agrees to go ahead.  I discussed the findings of the CT with the patient and her husband, they understand need for further imaging.  The patient is anxious and wants definitive procedure.  Further imaging should help delineate nature of the finding.  This could very well be mass versus "pseudotumor".  Will proceed with PET/CT and mapping CT with anticipation of robotic bronchoscopy on 28 December.  The  patient is aware that if repeat imaging shows involution/resolution of the findings and no FDG uptake, the procedure at that time will be canceled and she will be placed on observational protocol.  We will see the patient in follow-up on 03 July 2021.  Gailen Shelter, MD Advanced Bronchoscopy PCCM [facility]    *This note was dictated using voice recognition software/Dragon.  Despite best efforts to proofread, errors can occur which can change the meaning. Any transcriptional errors that result from this process are unintentional and may not be fully corrected at the time of dictation.

## 2021-05-29 NOTE — Telephone Encounter (Signed)
Phone pre admit visit 06/06/2021 between 1-5 and covid test 06/13/2021 at 9:10a  Patient is aware of above dates/times and voiced her understanding.

## 2021-05-29 NOTE — Telephone Encounter (Signed)
Robotic bronchoscopy with navigation and cellvizo scheduled for 06/14/2021 at 12:30. HQ:UIQN nodule [id number]  Synetta Fail, please see bronch info. Thanks

## 2021-05-30 NOTE — Telephone Encounter (Signed)
Noted.  Will close encounter.  

## 2021-05-30 NOTE — Telephone Encounter (Signed)
For the codes [id number], [phone] Prior Auth Not Required Refer # [id number]

## 2021-05-31 ENCOUNTER — Ambulatory Visit: Payer: BC Managed Care – PPO

## 2021-06-06 ENCOUNTER — Other Ambulatory Visit: Payer: Self-pay

## 2021-06-06 ENCOUNTER — Other Ambulatory Visit
Admission: RE | Admit: 2021-06-06 | Discharge: 2021-06-06 | Disposition: A | Discharge status: Home / Self Care | Payer: BC Managed Care – PPO | Source: Ambulatory Visit | Attending: Pulmonary Disease | Admitting: Pulmonary Disease

## 2021-06-06 HISTORY — DX: Headache, unspecified: R51.9

## 2021-06-06 NOTE — Patient Instructions (Signed)
Your procedure is scheduled on: Wednesday June 14, 2021. Report to Day Surgery inside [facility] 2nd floor.  To find out your arrival time please call [phone] between 1PM - 3PM on Tuesday June 13, 2021.  Remember: Instructions that are not followed completely may result in serious medical risk,  up to and including death, or upon the discretion of your surgeon and anesthesiologist your  surgery may need to be rescheduled.     _X__ 1. Do not eat food or drink fluids after midnight the night before your procedure.                 No chewing gum or hard candies.   __X__2.  On the morning of surgery brush your teeth with toothpaste and water, you                may rinse your mouth with mouthwash if you wish.  Do not swallow any toothpaste or mouthwash.     _X__ 3.  No Alcohol for 24 hours before or after surgery.   _X__ 4.  Do Not Smoke or use e-cigarettes For 24 Hours Prior to Your Surgery.                 Do not use any chewable tobacco products for at least 6 hours prior to                 Surgery.  _X__  5.  Do not use any recreational drugs (marijuana, cocaine, heroin, ecstasy, MDMA or other)                For at least one week prior to your surgery.  Combination of these drugs with anesthesia                May have life threatening results.  __X__6.  Notify your doctor if there is any change in your medical condition      (cold, fever, infections).     Do not wear jewelry, make-up, hairpins, clips or nail polish. Do not wear lotions, powders, or perfumes. You may wear deodorant. Do not shave 48 hours prior to surgery. Do not bring valuables to the hospital.    [facility] is not responsible for any belongings or valuables.  Contacts, dentures or bridgework may not be worn into surgery. Leave your suitcase in the car. After surgery it may be brought to your room. For patients admitted to the hospital, discharge time is determined by  your treatment team.   Patients discharged the day of surgery will not be allowed to drive home.   Make arrangements for someone to be with you for the first 24 hours of your Same Day Discharge.   __X__ Take these medicines the morning of surgery with A SIP OF WATER:    1. None   2.   3.   4.  5.  6.  ____ Fleet Enema (as directed)   ____ Use CHG Soap (or wipes) as directed  ____ Use Benzoyl Peroxide Gel as instructed  __X__ Use inhalers on the day of surgery  albuterol (VENTOLIN HFA) 108 (90 Base) MCG/ACT inhaler  ____ Stop metformin 2 days prior to surgery    ____ Take 1/2 of usual insulin dose the night before surgery. No insulin the morning          of surgery.   ____ Call your PCP, cardiologist, or Pulmonologist if taking Coumadin/Plavix/aspirin and ask when to stop before your  surgery.   __X__ One Week prior to surgery- Stop Anti-inflammatories such as Ibuprofen, Aleve, Advil, Motrin, meloxicam (MOBIC), diclofenac, etodolac, ketorolac, Toradol, Daypro, piroxicam, Goody's or BC powders. OK TO USE TYLENOL IF NEEDED   __X__ Stop supplements until after surgery.    ____ Bring C-Pap to the hospital.    If you have any questions regarding your pre-procedure instructions,  Please call Pre-admit Testing at [phone]

## 2021-06-07 ENCOUNTER — Ambulatory Visit
Admission: RE | Admit: 2021-06-07 | Discharge: 2021-06-07 | Disposition: A | Discharge status: Home / Self Care | Payer: BC Managed Care – PPO | Source: Ambulatory Visit | Attending: Pulmonary Disease | Admitting: Pulmonary Disease

## 2021-06-07 DIAGNOSIS — R911 Solitary pulmonary nodule: Secondary | ICD-10-CM

## 2021-06-07 LAB — GLUCOSE, CAPILLARY: Glucose-Capillary: 91 mg/dL (ref 70–99)

## 2021-06-07 MED ORDER — FLUDEOXYGLUCOSE F - 18 (FDG) INJECTION
9.3000 | Freq: Once | INTRAVENOUS | Status: AC | PRN
  Administered 2021-06-07: 12:00:00 9.7 via INTRAVENOUS

## 2021-06-13 ENCOUNTER — Other Ambulatory Visit
Admission: RE | Admit: 2021-06-13 | Discharge: 2021-06-13 | Disposition: A | Discharge status: Home / Self Care | Payer: BC Managed Care – PPO | Source: Ambulatory Visit | Attending: Pulmonary Disease | Admitting: Pulmonary Disease

## 2021-06-13 ENCOUNTER — Other Ambulatory Visit: Payer: Self-pay

## 2021-06-13 DIAGNOSIS — Z01812 Encounter for preprocedural laboratory examination: Secondary | ICD-10-CM | POA: Insufficient documentation

## 2021-06-13 DIAGNOSIS — Z20822 Contact with and (suspected) exposure to covid-19: Secondary | ICD-10-CM

## 2021-06-13 DIAGNOSIS — R911 Solitary pulmonary nodule: Secondary | ICD-10-CM | POA: Diagnosis not present

## 2021-06-13 DIAGNOSIS — Z7722 Contact with and (suspected) exposure to environmental tobacco smoke (acute) (chronic): Secondary | ICD-10-CM | POA: Diagnosis not present

## 2021-06-13 DIAGNOSIS — R519 Headache, unspecified: Secondary | ICD-10-CM | POA: Diagnosis not present

## 2021-06-13 DIAGNOSIS — Z8616 Personal history of COVID-19: Secondary | ICD-10-CM | POA: Diagnosis not present

## 2021-06-13 LAB — SARS CORONAVIRUS 2 (TAT 6-24 HRS): SARS Coronavirus 2: NEGATIVE

## 2021-06-14 ENCOUNTER — Other Ambulatory Visit: Payer: Self-pay

## 2021-06-14 ENCOUNTER — Encounter
Admission: RE | Disposition: A | Discharge status: Home / Self Care | Payer: Self-pay | Source: Ambulatory Visit | Attending: Pulmonary Disease

## 2021-06-14 ENCOUNTER — Other Ambulatory Visit: Payer: Self-pay | Admitting: Pulmonary Disease

## 2021-06-14 ENCOUNTER — Ambulatory Visit: Payer: BC Managed Care – PPO | Admitting: Certified Registered Nurse Anesthetist

## 2021-06-14 ENCOUNTER — Ambulatory Visit: Payer: BC Managed Care – PPO

## 2021-06-14 ENCOUNTER — Encounter: Payer: Self-pay | Admitting: Pulmonary Disease

## 2021-06-14 ENCOUNTER — Telehealth: Payer: Self-pay

## 2021-06-14 ENCOUNTER — Ambulatory Visit
Admission: RE | Admit: 2021-06-14 | Discharge: 2021-06-14 | Disposition: A | Discharge status: Home / Self Care | Payer: BC Managed Care – PPO | Source: Ambulatory Visit | Attending: Pulmonary Disease | Admitting: Pulmonary Disease

## 2021-06-14 DIAGNOSIS — Z7722 Contact with and (suspected) exposure to environmental tobacco smoke (acute) (chronic): Secondary | ICD-10-CM | POA: Diagnosis not present

## 2021-06-14 DIAGNOSIS — J852 Abscess of lung without pneumonia: Secondary | ICD-10-CM | POA: Diagnosis not present

## 2021-06-14 DIAGNOSIS — R911 Solitary pulmonary nodule: Secondary | ICD-10-CM | POA: Insufficient documentation

## 2021-06-14 DIAGNOSIS — Z20822 Contact with and (suspected) exposure to covid-19: Secondary | ICD-10-CM | POA: Diagnosis not present

## 2021-06-14 DIAGNOSIS — Z9889 Other specified postprocedural states: Secondary | ICD-10-CM | POA: Diagnosis not present

## 2021-06-14 DIAGNOSIS — R519 Headache, unspecified: Secondary | ICD-10-CM | POA: Insufficient documentation

## 2021-06-14 DIAGNOSIS — K388 Other specified diseases of appendix: Secondary | ICD-10-CM

## 2021-06-14 DIAGNOSIS — Z8616 Personal history of COVID-19: Secondary | ICD-10-CM | POA: Insufficient documentation

## 2021-06-14 DIAGNOSIS — R918 Other nonspecific abnormal finding of lung field: Secondary | ICD-10-CM

## 2021-06-14 LAB — POCT PREGNANCY, URINE: Preg Test, Ur: NEGATIVE

## 2021-06-14 SURGERY — BRONCHOSCOPY, WITH BIOPSY USING ELECTROMAGNETIC NAVIGATION
Anesthesia: General | Laterality: Right

## 2021-06-14 MED ORDER — IPRATROPIUM-ALBUTEROL 0.5-2.5 (3) MG/3ML IN SOLN
RESPIRATORY_TRACT | Status: AC
  Filled 2021-06-14: qty 3

## 2021-06-14 MED ORDER — ORAL CARE MOUTH RINSE: 15.0000 mL | Freq: Once | OROMUCOSAL | Status: AC

## 2021-06-14 MED ORDER — FAMOTIDINE 20 MG PO TABS
ORAL_TABLET | ORAL | Status: AC
  Filled 2021-06-14: qty 1

## 2021-06-14 MED ORDER — ONDANSETRON HCL 4 MG/2ML IJ SOLN
INTRAMUSCULAR | Status: DC | PRN
  Administered 2021-06-14: 4 mg via INTRAVENOUS

## 2021-06-14 MED ORDER — DEXMEDETOMIDINE (PRECEDEX) IN NS 20 MCG/5ML (4 MCG/ML) IV SYRINGE
PREFILLED_SYRINGE | INTRAVENOUS | Status: DC | PRN
  Administered 2021-06-14: 12 ug via INTRAVENOUS

## 2021-06-14 MED ORDER — FENTANYL CITRATE (PF) 100 MCG/2ML IJ SOLN
INTRAMUSCULAR | Status: DC | PRN
  Administered 2021-06-14 (×2): 50 ug via INTRAVENOUS

## 2021-06-14 MED ORDER — ONDANSETRON HCL 4 MG/2ML IJ SOLN: 4.0000 mg | Freq: Once | INTRAMUSCULAR | Status: DC | PRN

## 2021-06-14 MED ORDER — SEVOFLURANE IN SOLN
RESPIRATORY_TRACT | Status: AC
  Filled 2021-06-14: qty 250

## 2021-06-14 MED ORDER — FENTANYL CITRATE (PF) 100 MCG/2ML IJ SOLN
INTRAMUSCULAR | Status: AC
  Filled 2021-06-14: qty 2

## 2021-06-14 MED ORDER — MIDAZOLAM HCL 2 MG/2ML IJ SOLN
INTRAMUSCULAR | Status: DC | PRN
  Administered 2021-06-14: 2 mg via INTRAVENOUS

## 2021-06-14 MED ORDER — MIDAZOLAM HCL 2 MG/2ML IJ SOLN
INTRAMUSCULAR | Status: AC
  Filled 2021-06-14: qty 2

## 2021-06-14 MED ORDER — CHLORHEXIDINE GLUCONATE 0.12 % MT SOLN
15.0000 mL | Freq: Once | OROMUCOSAL | Status: AC
  Administered 2021-06-14: 12:00:00 15 mL via OROMUCOSAL

## 2021-06-14 MED ORDER — SUGAMMADEX SODIUM 200 MG/2ML IV SOLN
INTRAVENOUS | Status: DC | PRN
  Administered 2021-06-14: 300 mg via INTRAVENOUS

## 2021-06-14 MED ORDER — LIDOCAINE HCL (CARDIAC) PF 100 MG/5ML IV SOSY
PREFILLED_SYRINGE | INTRAVENOUS | Status: DC | PRN
  Administered 2021-06-14: 100 mg via INTRAVENOUS

## 2021-06-14 MED ORDER — PROPOFOL 10 MG/ML IV BOLUS
INTRAVENOUS | Status: DC | PRN
  Administered 2021-06-14: 150 mg via INTRAVENOUS

## 2021-06-14 MED ORDER — AMOXICILLIN-POT CLAVULANATE 875-125 MG PO TABS: 1.0000 | ORAL_TABLET | Freq: Two times a day (BID) | ORAL | 0 refills | Status: AC

## 2021-06-14 MED ORDER — MEPERIDINE HCL 25 MG/ML IJ SOLN: 6.2500 mg | INTRAMUSCULAR | Status: DC | PRN

## 2021-06-14 MED ORDER — DEXAMETHASONE SODIUM PHOSPHATE 10 MG/ML IJ SOLN
INTRAMUSCULAR | Status: DC | PRN
  Administered 2021-06-14: 10 mg via INTRAVENOUS

## 2021-06-14 MED ORDER — ROCURONIUM BROMIDE 100 MG/10ML IV SOLN
INTRAVENOUS | Status: DC | PRN
  Administered 2021-06-14: 50 mg via INTRAVENOUS
  Administered 2021-06-14 (×2): 10 mg via INTRAVENOUS

## 2021-06-14 MED ORDER — LACTATED RINGERS IV SOLN: INTRAVENOUS | Status: DC

## 2021-06-14 MED ORDER — FAMOTIDINE 20 MG PO TABS
20.0000 mg | ORAL_TABLET | Freq: Once | ORAL | Status: AC
  Administered 2021-06-14: 12:00:00 20 mg via ORAL

## 2021-06-14 MED ORDER — PHENYLEPHRINE HCL (PRESSORS) 10 MG/ML IV SOLN
INTRAVENOUS | Status: DC | PRN
  Administered 2021-06-14 (×2): 100 ug via INTRAVENOUS
  Administered 2021-06-14 (×2): 50 ug via INTRAVENOUS
  Administered 2021-06-14: 100 ug via INTRAVENOUS

## 2021-06-14 MED ORDER — IPRATROPIUM-ALBUTEROL 0.5-2.5 (3) MG/3ML IN SOLN
3.0000 mL | Freq: Once | RESPIRATORY_TRACT | Status: AC
  Administered 2021-06-14: 12:00:00 3 mL via RESPIRATORY_TRACT

## 2021-06-14 MED ORDER — CHLORHEXIDINE GLUCONATE 0.12 % MT SOLN
OROMUCOSAL | Status: AC
  Filled 2021-06-14: qty 15

## 2021-06-14 MED ORDER — SODIUM CHLORIDE 0.9 % IV SOLN: Freq: Once | INTRAVENOUS | Status: DC

## 2021-06-14 MED ORDER — FENTANYL CITRATE (PF) 100 MCG/2ML IJ SOLN: 25.0000 ug | INTRAMUSCULAR | Status: DC | PRN

## 2021-06-14 MED ORDER — PROPOFOL 10 MG/ML IV BOLUS
INTRAVENOUS | Status: AC
  Filled 2021-06-14: qty 20

## 2021-06-14 NOTE — Op Note (Addendum)
Robotic assisted bronchoscopy Cellvizio probe based confocal laser endomicroscopy (pCLE) Augmented fluoroscopy   Indication: Right middle lobe lesion 2.0 x 2.7 cm  Preoperative Diagnosis: Right middle lobe lesion, rule out cancer Post Procedure Diagnosis: Obstructing lesion right middle lobe very aspiration/query abscess Consent: Verbal/Written: obtained  Benefits, limitations and potential complications of the procedure were discussed with the patient/family.  Complications from bronchoscopy are rare and most often minor, but if they occur they may include breathing difficulty, vocal cord spasm, hoarseness, slight fever, vomiting, dizziness, bronchospasm, infection, low blood oxygen, bleeding from biopsy site, or an allergic reaction to medications.  It is uncommon for patients to experience other more serious complications for example: Collapsed lung requiring chest tube placement, respiratory failure, heart attack and/or cardiac arrhythmia.  Patient understood the potential complications and agreed to proceed.  Surgeon: Renold Don, MD Assistant/Scrub: Sullivan Lone, RRT Circulator: Annia Belt, RRT Anesthesiologist/CRNA: Huey Romans Renee Ramus, CRNA Cytotechnology: Maryan Puls, Fluoroscopy technician: Alcide Goodness, RT Representatives: Scarlette Slice, Body Vision/Lindsey Marval Regal ([employer])  Type of Anesthesia: General endotracheal  Procedures Performed:   Robotic bronchoscopy: Procedure consists of robotic navigation comprised of electromagnetics, optical pattern recognition and robotic kinematic data - to triangulate bronchoscope location during the procedure and provide accurate positional data to biopsy a lesion. Cellvizio probe based confocal laser endomicroscopy (pCLE) utilizing blue laser endomicroscopy. Augmented fluoroscopy with Body Vision.  Description of Procedure:  Robotic bronchoscopy: The patient was brought to Procedure Room 2  (Bronchoscopy Suite) in the OR area where appropriate timeout was taken with the staff after the patient was inducted under general anesthesia.  The patient was inducted under general anesthesia and intubated by the anesthesia team.  Patient was intubated with a 8.5 ET tube without difficulty.  Tube was secured at 4 cm above the carina.  A Portex adapter was placed on the ET tube flange.  Once the patient was under adequate general anesthesia the Olympus therapeutic video bronchoscope was advanced and an anatomic airway tour and surveillance bronchoscopy was performed.The distal trachea appeared unremarkable. The main carina was sharp.  No secretions were seen in either right or left mainstem bronchi. The RUL, RLL, RML appeared to be free of endobronchial masses, lesions, or purulent secretions. Likewise, the LLL/LUL appeared to be free of endobronchial masses, lesions, or purulent secretions. Once the survey bronchoscopy was completed, registration for the augmented fluoroscopy (Body Vision) was then performed with the fluoroscopic C arm.  Once this was completed, the robotic bronchoscope ET tube adapter was placed and ETT was cut to proper length and secured on the mid plane.  The [facility] robotic scope was then advanced through the ETT and registration was performed successfully.  There was good correlation between the robotic mapping and bronchoscopic mapping. With the assistance of fused navigation, the bronchoscope was advanced to the RML lesion.  As the bronchoscope was advanced it reach an area where no further navigation was possible close examination showed inflammation of a very distal bronchiole in this area.  At this point Cellvizio probe based confocal laser endomicroscopy (pCLE) was utilized to try to advance and position the scope via Seldinger technique.  However, this also could not be done as there was actual obstruction at this bronchiole level.  The images through Cellvizio were consistent with  inflammation changes.  There were copious secretions noted in this very distal airway and there was some airway collapse noted despite PEEP being at 10.  At this point after further lavage and aspiration it was  evident there was a lesion occluding this bronchiole.that was impeding progression to the noted lesion on CT.  The robotic bronchoscope was anchored to maintain position. Forceps biopsies were then advanced and noted to be off alignment with the lesion.  Using direct visualization with the [facility] robot the forceps were then aligned with the lesion for biopsy.  As the biopsy was obtained copious amount of serosanguineous/purulent fluid emerged.  The fluid was aspirated and placed in preservative for cytology.  Augmented fluoroscopy via Body Vision was then utilized to visualize the lesion.  A "spin" was performed to obtain CT image via augmented fluoroscopy.  It appeared that the lesion had become more elongated and decreased in width, this could represent a partly drained abscess.  At this point a targeted bronchoalveolar lavage was performed and approximately 8 mL were obtained after 20 mL of normal saline were instilled.  This fluid was sent for cultures. The robotic bronchoscope was then retracted all the way out after confirmation of excellent hemostasis.  The patient received bronchial lavage with 10 mL of 1% lidocaine via the ET tube. The patient tolerated the procedure well. No significant bleeding was observed at the conclusion of the procedure.  At this point, the patient was allowed to emerge from general anesthesia, and was extubated in the procedure room without incident.  The patient  was taken to the PACU in satisfactory condition.  Auscultation of the lungs showed no change from pre bronchoscopy examination.  Patient tolerated the procedure very well with no untoward effects of anesthesia noted.   Specimens Obtained:  Transbronchial Forceps Biopsy: X1  Transbronchial Brush:  N/A  Aspiration of fluid: Approximately 10 to 15 mL, serosanguineous/purulent  Targeted BAL: Approximately 8 mL  Fluoroscopy: Augmented fluoroscopy (Body Vision) was utilized during the course of this procedure to assure that biopsies were taken in a safe manner under fluoroscopic guidance with spot films required.  Total fluoroscopy time:  1 min 33 sec, 11.98 mGy dose.  Intraoperative images:  Inflammation and bronchiole occlusion noted at very distal RML bronchus (not visible with regular bronchoscope, many generations removed).   Endobronchiole lesion noted prior to reaching target.   Biopsy forceps off target.   Biopsy forceps repositioned under direct visualization.   Biopsy obtained under direct visualization.  Postbiopsy, good hemostasis.   Bronchiole clear after biopsy, good hemostasis.    Complications:None, no pneumothorax on post film, note that RML lesion is less rounded :    Estimated Blood Loss: Less than 5 mL  Assessment and Plan/Additional Comments: Endobronchial obstruction noted at distal bronchiole in the right middle lobe Suspect aspiration, abscess formation Material sent for pathology/cytology and cultures Treat with Augmentin 875 twice daily x14 days Await pathology report    C. Derrill Kay, MD Advanced Bronchoscopy PCCM [facility]    *This note was dictated using voice recognition software/Dragon.  Despite best efforts to proofread, errors can occur which can change the meaning.  Any change was purely unintentional.

## 2021-06-14 NOTE — Anesthesia Procedure Notes (Signed)
Procedure Name: Intubation Date/Time: 06/14/2021 12:51 PM Performed by: Jaye Beagle, CRNA Pre-anesthesia Checklist: Patient identified, Emergency Drugs available, Suction available and Patient being monitored Patient Re-evaluated:Patient Re-evaluated prior to induction Oxygen Delivery Method: Circle system utilized Preoxygenation: Pre-oxygenation with 100% oxygen Induction Type: IV induction Ventilation: Mask ventilation without difficulty Laryngoscope Size: McGraph and 3 Grade View: Grade I Tube type: Oral Tube size: 8.5 mm Number of attempts: 1 Airway Equipment and Method: Stylet and Oral airway Placement Confirmation: ETT inserted through vocal cords under direct vision, positive ETCO2 and breath sounds checked- equal and bilateral Secured at: 18 cm Tube secured with: Tape Dental Injury: Teeth and Oropharynx as per pre-operative assessment

## 2021-06-14 NOTE — Anesthesia Postprocedure Evaluation (Signed)
Anesthesia Post Note  Patient: Denise Stephens  Procedure(s) Performed: ROBOTIC ASSISTED NAVIGATIONAL BRONCHOSCOPY (Right)  Anesthesia Type: General Anesthetic complications: no   No notable events documented.   Last Vitals:  Vitals:   06/14/21 1500 06/14/21 1508  BP: 106/62 118/63  Pulse: 69 72  Resp: 18 16  Temp: (!) 36.3 C (!) 36.2 C  SpO2: 100% 95%    Last Pain:  Vitals:   06/14/21 1508  TempSrc: Temporal  PainSc: 0-No pain                 Manfred Arch

## 2021-06-14 NOTE — Interval H&P Note (Signed)
History and Physical Interval Note:  06/14/2021 11:32 AM  Denise Stephens  has presented today for surgery, with the diagnosis of LUNG NODULE.  The various methods of treatment have been discussed with the patient and family. After consideration of risks, benefits and other options for treatment, the patient has consented to  Procedure(s): ROBOTIC ASSISTED NAVIGATIONAL BRONCHOSCOPY (Right) as a surgical intervention.  The patient's history has been reviewed, patient examined, no change in status, stable for surgery.  I have reviewed the patient's chart and labs.  Questions were answered to the patient's satisfaction.     Sarina Ser

## 2021-06-14 NOTE — Progress Notes (Signed)
On recent PET/CT is of a fluid density of 2 cm x 1.4 cm adjacent to and potentially continues with the appendix.  Adjacent to the cecum.  Could be duplication cyst but appendiceal mucocele cannot be excluded.  Will refer to general surgery for recommendations.  Patient aware.  Gailen Shelter, MD Advanced Bronchoscopy PCCM [facility]

## 2021-06-14 NOTE — Telephone Encounter (Signed)
Per LG verbally- patient will need CXR prior to 07/03/2021 visit. CXR ordered.

## 2021-06-14 NOTE — Transfer of Care (Signed)
Immediate Anesthesia Transfer of Care Note  Patient: Denise Stephens  Procedure(s) Performed: ROBOTIC ASSISTED NAVIGATIONAL BRONCHOSCOPY (Right)  Patient Location: PACU  Anesthesia Type:General  Level of Consciousness: drowsy  Airway & Oxygen Therapy: Patient Spontanous Breathing and Patient connected to face mask oxygen  Post-op Assessment: Report given to RN  Post vital signs: stable  Last Vitals:  Vitals Value Taken Time  BP 106/53 06/14/21 1415  Temp 36.2 C 06/14/21 1414  Pulse 74 06/14/21 1415  Resp 17 06/14/21 1415  SpO2 100 % 06/14/21 1415  Vitals shown include unvalidated device data.  Last Pain:  Vitals:   06/14/21 1414  TempSrc:   PainSc: Asleep         Complications: No notable events documented.

## 2021-06-14 NOTE — Anesthesia Preprocedure Evaluation (Addendum)
Anesthesia Evaluation  Patient identified by MRN, date of birth, ID band Patient awake    Reviewed: Allergy & Precautions, NPO status , Patient's Chart, lab work & pertinent test results  Airway Mallampati: II  TM Distance: >3 FB Neck ROM: Full    Dental no notable dental hx.    Pulmonary pneumonia, resolved,    Pulmonary exam normal        Cardiovascular negative cardio ROS Normal cardiovascular exam     Neuro/Psych  Headaches, negative psych ROS   GI/Hepatic negative GI ROS, Neg liver ROS,   Endo/Other  negative endocrine ROS  Renal/GU negative Renal ROS  negative genitourinary   Musculoskeletal negative musculoskeletal ROS (+)   Abdominal   Peds negative pediatric ROS (+)  Hematology negative hematology ROS (+)   Anesthesia Other Findings Headache    Lobar pneumonia (HCC) 05/04/2015 Bronchitis 05/08/2021  COVID-19 05/25/2020  IBS (irritable bowel syndrome) 05/04/2015  Excess, menstruation 05/04/2015  Vitamin D deficiency 05/04/2015        Reproductive/Obstetrics negative OB ROS                            Anesthesia Physical Anesthesia Plan  ASA: 2  Anesthesia Plan: General   Post-op Pain Management:    Induction: Intravenous  PONV Risk Score and Plan: 3 and Propofol infusion, Ondansetron and Midazolam  Airway Management Planned: Oral ETT  Additional Equipment:   Intra-op Plan:   Post-operative Plan: Extubation in OR  Informed Consent: I have reviewed the patients History and Physical, chart, labs and discussed the procedure including the risks, benefits and alternatives for the proposed anesthesia with the patient or authorized representative who has indicated his/her understanding and acceptance.       Plan Discussed with: CRNA, Anesthesiologist and Surgeon  Anesthesia Plan Comments:         Anesthesia Quick Evaluation

## 2021-06-14 NOTE — Discharge Instructions (Signed)
AMBULATORY SURGERY  ?DISCHARGE INSTRUCTIONS ? ? ?The drugs that you were given will stay in your system until tomorrow so for the next 24 hours you should not: ? ?Drive an automobile ?Make any legal decisions ?Drink any alcoholic beverage ? ? ?You may resume regular meals tomorrow.  Today it is better to start with liquids and gradually work up to solid foods. ? ?You may eat anything you prefer, but it is better to start with liquids, then soup and crackers, and gradually work up to solid foods. ? ? ?Please notify your doctor immediately if you have any unusual bleeding, trouble breathing, redness and pain at the surgery site, drainage, fever, or pain not relieved by medication. ? ? ? ?Additional Instructions: ? ? ? ?Please contact your physician with any problems or Same Day Surgery at [phone], Monday through Friday 6 am to 4 pm, or [place] [facility] [place] [facility] number at [phone].  ?

## 2021-06-15 ENCOUNTER — Encounter: Payer: Self-pay | Admitting: Pulmonary Disease

## 2021-06-15 LAB — CYTOLOGY - NON PAP

## 2021-06-15 LAB — SURGICAL PATHOLOGY

## 2021-06-16 LAB — ACID FAST SMEAR (AFB, MYCOBACTERIA): Acid Fast Smear: NEGATIVE

## 2021-06-17 LAB — CULTURE, RESPIRATORY W GRAM STAIN
Culture: NO GROWTH
Gram Stain: NONE SEEN

## 2021-06-22 ENCOUNTER — Encounter: Payer: Self-pay | Admitting: Surgery

## 2021-06-22 ENCOUNTER — Ambulatory Visit (INDEPENDENT_AMBULATORY_CARE_PROVIDER_SITE_OTHER): Payer: BC Managed Care – PPO | Admitting: Surgery

## 2021-06-22 ENCOUNTER — Other Ambulatory Visit: Payer: Self-pay

## 2021-06-22 VITALS — BP 137/81 | HR 88 | Temp 99.2°F | Ht 61.0 in | Wt 180.4 lb

## 2021-06-22 DIAGNOSIS — R933 Abnormal findings on diagnostic imaging of other parts of digestive tract: Secondary | ICD-10-CM | POA: Insufficient documentation

## 2021-06-22 NOTE — Progress Notes (Signed)
Patient ID: Denise Stephens, female   DOB: 09/26/1970, 51 y.o.   MRN: [id number]  Chief Complaint: Incidental finding on PET scan/CT.  History of Present Illness Denise Stephens is a 51 y.o. female with a PET scan for suspicion of pulmonary neoplasm, picking up a cystic lesion near the ileocecal/appendiceal region.  Radiologist felt it may be further defined by definitive CT with the appropriate contrast.  Patient is otherwise asymptomatic.  Denying weight loss, gastrointestinal function changes, abdominal pain, nausea and vomiting or diarrhea.  Past Medical History Past Medical History:  Diagnosis Date   Bronchitis    Headache    Lobar pneumonia ([place]) 05/04/2015      Past Surgical History:  Procedure Laterality Date   CESAREAN SECTION  200, 2002, 2009   x3   TUBAL LIGATION      Allergies  Allergen Reactions   Sulfa Antibiotics Other (See Comments)    Unsure of reaction type    Current Outpatient Medications  Medication Sig Dispense Refill   albuterol (VENTOLIN HFA) 108 (90 Base) MCG/ACT inhaler INHALE 1-2 PUFFS INTO THE LUNGS EVERY 6 (SIX) HOURS AS NEEDED FOR SHORTNESS OF BREATH. 6.7 each 0   amoxicillin-clavulanate (AUGMENTIN) 875-125 MG tablet Take 1 tablet by mouth 2 (two) times daily for 14 days. Take with food. 28 tablet 0   Aspirin-Salicylamide-Caffeine (BC HEADACHE POWDER PO) Take 1 packet by mouth daily as needed (headaches).     Ferrous Sulfate (IRON) 28 MG TABS Take 28 mg by mouth daily with lunch.     naproxen (NAPROSYN) 500 MG tablet Take 1 tablet (500 mg total) by mouth 2 (two) times daily with a meal. As needed for pain (Patient taking differently: Take 500 mg by mouth 2 (two) times daily as needed (severe menstrual pain.).) 60 tablet 4   rizatriptan (MAXALT) 10 MG tablet Take 1 tablet earliest onset of migraine.  May repeat in 2 hours if needed.  Maximum 2 tablets in 24 hours. 10 tablet 5   topiramate (TOPAMAX) 50 MG tablet Take 1 tablet (50 mg total)  by mouth at bedtime. 90 tablet 1   No current facility-administered medications for this visit.    Family History Family History  Problem Relation Age of Onset   Heart disease Father    Bone cancer Father 83   Lung cancer Father 28   COPD Father    Thyroid disease Sister    Colon cancer Paternal Grandfather 58      Social History Social History   Tobacco Use   Smoking status: Never   Smokeless tobacco: Never  Vaping Use   Vaping Use: Never used  Substance Use Topics   Alcohol use: No    Alcohol/week: 0.0 standard drinks   Drug use: No        Review of Systems  Constitutional: Negative.   HENT: Negative.    Eyes: Negative.   Respiratory: Negative.    Cardiovascular: Negative.   Gastrointestinal: Negative.   Genitourinary: Negative.   Skin: Negative.   Neurological: Negative.   Psychiatric/Behavioral: Negative.       Physical Exam Last menstrual period 05/25/2021.   CONSTITUTIONAL: Well developed, and nourished, appropriately responsive and aware without distress.   EYES: Sclera non-icteric.   EARS, NOSE, MOUTH AND THROAT: Mask worn.    Hearing is intact to voice.  NECK: Trachea is midline, and there is no appreciable jugular venous distension.  LYMPH NODES:  Lymph nodes in the neck are not appreciated. RESPIRATORY:  Lungs  are clear, and breath sounds are equal bilaterally. Normal respiratory effort without pathologic use of accessory muscles. CARDIOVASCULAR: Heart is regular in rate and rhythm. GI: The abdomen is  soft, nontender, and nondistended. There were no palpable masses. I did not appreciate hepatosplenomegaly.  MUSCULOSKELETAL:  Symmetrical muscle tone appreciated in all four extremities.    SKIN: Skin turgor is normal. No pathologic skin lesions appreciated.  NEUROLOGIC:  Motor and sensation appear grossly normal.  Cranial nerves are grossly without defect. PSYCH:  Alert and oriented to person, place and time. Affect is appropriate for  situation.  Data Reviewed I have personally reviewed what is currently available of the patient's imaging, recent labs and medical records.   Labs:  CBC Latest Ref Rng & Units 03/18/2019 07/18/2016 03/15/2014  WBC 3.4 - 10.8 x10E3/uL 9.1 6.7 7.5  Hemoglobin 11.1 - 15.9 g/dL 11.8 11.2 11.7(A)  Hematocrit 34.0 - 46.6 % 38.6 36.8 35(A)  Platelets 150 - 450 x10E3/uL 279 338 322   CMP Latest Ref Rng & Units 07/18/2016 03/15/2014  Glucose 65 - 99 mg/dL 90 -  BUN 6 - 24 mg/dL 7 8  Creatinine 0.57 - 1.00 mg/dL 0.77 0.8  Sodium 134 - 144 mmol/L 140 140  Potassium 3.5 - 5.2 mmol/L 4.3 4.6  Chloride 96 - 106 mmol/L 100 -  CO2 18 - 29 mmol/L 26 -  Calcium 8.7 - 10.2 mg/dL 9.0 -  Total Protein 6.0 - 8.5 g/dL 6.4 -  Total Bilirubin 0.0 - 1.2 mg/dL 0.4 -  Alkaline Phos 39 - 117 IU/L 53 -  AST 0 - 40 IU/L 15 12(A)  ALT 0 - 32 IU/L 11 14      Imaging: Radiology review:  CLINICAL DATA:  Initial treatment strategy for lung nodule.   EXAM: NUCLEAR MEDICINE PET SKULL BASE TO THIGH   TECHNIQUE: 9.7 mCi F-18 FDG was injected intravenously. Full-ring PET imaging was performed from the skull base to thigh after the radiotracer. CT data was obtained and used for attenuation correction and anatomic localization.   Fasting blood glucose: 91 mg/dl   COMPARISON:  Multiple exams, including CT chest 05/24/2021 and 06/07/2021   FINDINGS: Mediastinal blood pool activity: SUV max 2.4   Liver activity: SUV max 3.9   NECK: No significant abnormal hypermetabolic activity in this region.   Incidental CT findings: Chronic bilateral maxillary sinusitis.   CHEST: The rounded 2.6 by 2.0 cm right middle lobe nodule on image 82 of series 3 has a maximum SUV of 1.7. No pathologically enlarged or hypermetabolic adenopathy in the chest identified.   Incidental CT findings: Gas-filled esophagus similar to 05/24/2021, cannot exclude esophageal dysmotility although the esophagus is not overtly dilated.    ABDOMEN/PELVIS: Physiologic activity in bowel. No specific worrisome hypermetabolic lesion 2.0 cm fluid density photopenic lesion posteriorly in the right hepatic lobe favoring cyst. Similar hypodense lesion in segment 3 of the liver.   Incidental CT findings: 2.0 by 1.4 cm fluid density lesion adjacent to the cecum and adjacent to and possibly continuous with the appendix. No associated hypermetabolic activity. Possibilities may include duplication cyst or possibly an appendiceal mucocele.   SKELETON: No significant abnormal hypermetabolic activity in this region.   Incidental CT findings: none   IMPRESSION: 1. The right middle lobe nodule demonstrates low-grade activity with maximum SUV of 1.7. Low-grade neoplasm is a differential diagnostic consideration and tissue diagnosis is likely warranted. 2. Fluid density 2.0 by 1.4 cm structure adjacent to and potentially continuous with the appendix,  and adjacent to the cecum. Although this could be a duplication cyst, I cannot exclude an appendiceal mucocele based on today's images. If this indeed represents mucocele, surgical resection would likely be warranted given the potential for rupture and pseudomyxoma peritonei. It is possible that a dedicated diagnostic CT abdomen/pelvis with oral and IV contrast might be able to further characterize this lesion. 3. Gas-filled esophagus similar to the prior exam, cannot exclude esophageal dysmotility. 4. Chronic bilateral maxillary sinusitis.   Electronically Signed   By: Van Clines M.D.   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 Patient Active Problem List   Diagnosis Date Noted   Bronchitis 05/08/2021   COVID-19 05/25/2020   IBS (irritable bowel syndrome) 05/04/2015   Excess, menstruation 05/04/2015   Vitamin D deficiency 05/04/2015    Plan    We will proceed with definitive abdominal  pelvic CT with p.o. and IV contrast. We discussed that this may not further delineate our diagnosis and still may require surgical intervention to reach a tissue diagnosis. We discussed robotic assisted laparoscopic intervention, potentially including appendectomy, or even more aggressively requiring right hemicolectomy.  The risks of laparoscopic procedures include but are not limited to anesthesia, bleeding, infection, potential fistula or leak from anastomotic issues.  I believe she understands and I will discuss further with her if we need to proceed with an operation.  Face-to-face time spent with the patient and accompanying care providers(if present) was 25 minutes, with more than 50% of the time spent counseling, educating, and coordinating care of the patient.    These notes generated with voice recognition software. I apologize for typographical errors.  Ronny Bacon M.D., FACS 06/22/2021, 3:43 PM

## 2021-06-22 NOTE — Patient Instructions (Addendum)
Your CT is scheduled for 07/11/2021 @ 11:30 am (arrive by 11 am) at the Outpatient  Imaging on [facility]. Nothing to eat or drink 4 hours prior and please pick up contrast between now and the day before scan.    If you have any concerns or questions, please feel free to call our office. See follow up appointment below.

## 2021-06-26 ENCOUNTER — Telehealth: Payer: Self-pay | Admitting: Pulmonary Disease

## 2021-06-26 MED ORDER — FLUCONAZOLE 150 MG PO TABS: ORAL_TABLET | ORAL | 0 refills | Status: DC

## 2021-06-26 NOTE — Telephone Encounter (Signed)
Lm for patient.  

## 2021-06-26 NOTE — Telephone Encounter (Signed)
Recommend that she get over-the-counter some Culturelle probiotic and start taking that for the next several weeks.  Also increase intake of yogurt such as Activia.  Recommend also Monistat over-the-counter.  Send fluconazole 150 mg 1 tablet 72 hours apart.  Dispense #2, 0 refills.

## 2021-06-26 NOTE — Telephone Encounter (Signed)
Patient is aware of recommendations and voiced her understanding.  Diflucan sent to preferred pharmacy.  Nothing further needed at this time.

## 2021-06-26 NOTE — Telephone Encounter (Signed)
Spoke to patient. Prescribed 14 days of amoxicillins on 06/14/2021. She has 2 days left but she has developed a yeast infection.  She stated that respiratory sx have improved.    Dr. Jayme Cloud, please advise.

## 2021-06-29 NOTE — Telephone Encounter (Signed)
Patient is aware that I have spoken to her since leaving a message.  Nothing further needed at this time.

## 2021-07-03 ENCOUNTER — Ambulatory Visit
Admission: RE | Admit: 2021-07-03 | Discharge: 2021-07-03 | Disposition: A | Discharge status: Home / Self Care | Payer: BC Managed Care – PPO | Source: Ambulatory Visit | Attending: Pulmonary Disease | Admitting: Pulmonary Disease

## 2021-07-03 ENCOUNTER — Other Ambulatory Visit: Payer: Self-pay

## 2021-07-03 ENCOUNTER — Ambulatory Visit (INDEPENDENT_AMBULATORY_CARE_PROVIDER_SITE_OTHER): Payer: BC Managed Care – PPO | Admitting: Pulmonary Disease

## 2021-07-03 ENCOUNTER — Ambulatory Visit
Admission: RE | Admit: 2021-07-03 | Discharge: 2021-07-03 | Disposition: A | Discharge status: Home / Self Care | Payer: BC Managed Care – PPO | Attending: Pulmonary Disease | Admitting: Pulmonary Disease

## 2021-07-03 ENCOUNTER — Encounter: Payer: Self-pay | Admitting: Pulmonary Disease

## 2021-07-03 VITALS — BP 118/82 | HR 77 | Temp 97.8°F | Ht 61.0 in | Wt 179.4 lb

## 2021-07-03 DIAGNOSIS — R911 Solitary pulmonary nodule: Secondary | ICD-10-CM | POA: Diagnosis not present

## 2021-07-03 DIAGNOSIS — R052 Subacute cough: Secondary | ICD-10-CM

## 2021-07-03 DIAGNOSIS — J852 Abscess of lung without pneumonia: Secondary | ICD-10-CM

## 2021-07-03 NOTE — Patient Instructions (Signed)
Your x-ray today showed that the area we were following is almost completely gone.  This was a lung abscess.   We will order chest CT for 4 to 6 weeks down the line to make sure that the abscess has resolved.   We will see you in follow-up after that CT is done.

## 2021-07-03 NOTE — Progress Notes (Signed)
Subjective:    Patient ID: Denise Stephens, female    DOB: 02/06/1971, 51 y.o.   MRN: [id number] Chief Complaint  Patient presents with   Follow-up    HPI Denise Stephens is a 51 year old lifelong never smoker who presents for follow-up on the issue of a mass on the right middle lobe.  She was initially evaluated on 29 May 2021, she underwent robotic bronchoscopy on 14 June 2021.  For the details findings please see the operative report.  The patient had distal obstruction noted likely due to a food particle.  And fluid was released after this was removed from the airway.  It was deemed that the lesion in the lung was an abscess.  The patient will has been treated with Augmentin for 14 days.  She did have an issue with candidal vaginitis and this was treated with fluconazole.  She presents today for follow-up.  She states that since the bronchoscopy her cough has almost completely resolved.  Cough now occurs very rarely and its nonproductive.  Not had any fevers, chills or sweats.  No hemoptysis.  She does not endorse dyspepsia or gastroesophageal reflux symptoms.  I did discuss with her that CT chest was consistent with potential esophageal dysmotility but she is not having symptoms of this.  Will need to be monitored.  Overall she feels well and looks well.  We did review chest x-ray from today.  This shows that the area previously noted in the right middle lobe is almost completely resolved.  This is consistent with the lung abscess diagnosis.  Review of Systems A 10 point review of systems was performed and it is as noted above otherwise negative.  Patient Active Problem List   Diagnosis Date Noted   Abnormal CT scan, gastrointestinal tract 06/22/2021   Bronchitis 05/08/2021   COVID-19 05/25/2020   IBS (irritable bowel syndrome) 05/04/2015   Excess, menstruation 05/04/2015   Vitamin D deficiency 05/04/2015   Social History   Tobacco Use   Smoking status: Never    Smokeless tobacco: Never  Substance Use Topics   Alcohol use: No    Alcohol/week: 0.0 standard drinks   Allergies  Allergen Reactions   Sulfa Antibiotics Other (See Comments)    Unsure of reaction type   Current Meds  Medication Sig   albuterol (VENTOLIN HFA) 108 (90 Base) MCG/ACT inhaler INHALE 1-2 PUFFS INTO THE LUNGS EVERY 6 (SIX) HOURS AS NEEDED FOR SHORTNESS OF BREATH.   Ferrous Sulfate (IRON) 28 MG TABS Take 28 mg by mouth daily with lunch.   naproxen (NAPROSYN) 500 MG tablet Take 1 tablet (500 mg total) by mouth 2 (two) times daily with a meal. As needed for pain (Patient taking differently: Take 500 mg by mouth 2 (two) times daily as needed (severe menstrual pain.).)   rizatriptan (MAXALT) 10 MG tablet Take 1 tablet earliest onset of migraine.  May repeat in 2 hours if needed.  Maximum 2 tablets in 24 hours.   topiramate (TOPAMAX) 50 MG tablet Take 1 tablet (50 mg total) by mouth at bedtime.   [DISCONTINUED] Aspirin-Salicylamide-Caffeine (BC HEADACHE POWDER PO) Take 1 packet by mouth daily as needed (headaches).   [DISCONTINUED] fluconazole (DIFLUCAN) 150 MG tablet 1tab every 72hr.   Immunization History  Administered Date(s) Administered   Influenza,inj,Quad PF,6+ Mos 03/04/2013   Tdap 03/04/2013       Objective:   Physical Exam BP 118/82 (BP Location: Left Arm, Patient Position: Sitting, Cuff Size: Normal)    Pulse 77  Temp 97.8 F (36.6 C) (Oral)    Ht 5\' 1"  (1.549 m)    Wt 179 lb 6.4 oz (81.4 kg)    SpO2 96%    BMI 33.90 kg/m  GENERAL: Obese woman, no acute distress, fully ambulatory, no conversational dyspnea. HEAD: Normocephalic, atraumatic.  EYES: Pupils equal, round, reactive to light.  No scleral icterus.  MOUTH: Nose/mouth/throat not examined due to masking requirements for COVID 19. NECK: Supple. No thyromegaly. Trachea midline. No JVD.  No adenopathy. PULMONARY: Good air entry bilaterally.  No adventitious sounds. CARDIOVASCULAR: S1 and S2. Regular rate and  rhythm.  No rubs, murmurs or gallops heard. ABDOMEN: Obese, otherwise benign. MUSCULOSKELETAL: No joint deformity, no clubbing, no edema.  NEUROLOGIC: No focal deficit, no gait disturbance, speech is fluent. SKIN: Intact,warm,dry. PSYCH: Mood and behavior normal  Images from chest x-rays performed 08 May 2021 (1) and 03 July 2021 (2) showing reduction in size of the lesion on the right middle lobe:      Assessment & Plan:     ICD-10-CM   1. Abscess of middle lobe of right lung without pneumonia (HCC)  J85.2 CT CHEST WO CONTRAST   Status post bronchoscopy 14 June 2040 Partial drainage of abscess Subsequently treated with Augmentin x14 days Repeat CT chest 4 to 6 weeks     2. Subacute cough  R05.2    Resolving with management of lung abscess     We will repeat CT chest in 4 to 6 weeks to ensure resolution of the abscess.  We will see her in follow-up after that CT is performed.  She is to contact 17 June 2019 prior to that time should any new difficulties arise  C. [place], MD Advanced Bronchoscopy PCCM [facility]    *This note was dictated using voice recognition software/Dragon.  Despite best efforts to proofread, errors can occur which can change the meaning. Any transcriptional errors that result from this process are unintentional and may not be fully corrected at the time of dictation.

## 2021-07-09 ENCOUNTER — Other Ambulatory Visit: Payer: Self-pay | Admitting: Family Medicine

## 2021-07-09 DIAGNOSIS — R059 Cough, unspecified: Secondary | ICD-10-CM

## 2021-07-09 DIAGNOSIS — R0602 Shortness of breath: Secondary | ICD-10-CM

## 2021-07-09 NOTE — Telephone Encounter (Signed)
Requested Prescriptions  Pending Prescriptions Disp Refills   albuterol (VENTOLIN HFA) 108 (90 Base) MCG/ACT inhaler [Pharmacy Med Name: ALBUTEROL HFA (PROVENTIL) INH] 6.7 each 0    Sig: INHALE 1-2 PUFFS INTO THE LUNGS EVERY 6 (SIX) HOURS AS NEEDED FOR SHORTNESS OF BREATH.     Pulmonology:  Beta Agonists Failed - 07/09/2021  9:58 AM      Failed - One inhaler should last at least one month. If the patient is requesting refills earlier, contact the patient to check for uncontrolled symptoms.      Passed - Valid encounter within last 12 months    Recent Outpatient Visits          2 months ago Bronchitis   [facility] Malva Limes, MD   1 year ago Bronchitis   [facility] Chrismon, Jodell Cipro, PA-C   1 year ago COVID-19   [facility] Malva Limes, MD   3 years ago Pharyngitis, unspecified etiology   [facility] Malva Limes, MD   3 years ago Acute sinusitis, recurrence not specified, unspecified location   [facility] Sherrie Mustache, Demetrios Isaacs, MD

## 2021-07-11 ENCOUNTER — Other Ambulatory Visit: Payer: Self-pay

## 2021-07-11 ENCOUNTER — Ambulatory Visit
Admission: RE | Admit: 2021-07-11 | Discharge: 2021-07-11 | Disposition: A | Discharge status: Home / Self Care | Payer: BC Managed Care – PPO | Source: Ambulatory Visit | Attending: Surgery | Admitting: Surgery

## 2021-07-11 DIAGNOSIS — R933 Abnormal findings on diagnostic imaging of other parts of digestive tract: Secondary | ICD-10-CM | POA: Insufficient documentation

## 2021-07-11 DIAGNOSIS — K7689 Other specified diseases of liver: Secondary | ICD-10-CM | POA: Diagnosis not present

## 2021-07-11 DIAGNOSIS — K388 Other specified diseases of appendix: Secondary | ICD-10-CM | POA: Diagnosis not present

## 2021-07-11 DIAGNOSIS — K6389 Other specified diseases of intestine: Secondary | ICD-10-CM | POA: Diagnosis not present

## 2021-07-11 MED ORDER — IOHEXOL 300 MG/ML  SOLN
100.0000 mL | Freq: Once | INTRAMUSCULAR | Status: AC | PRN
  Administered 2021-07-11: 12:00:00 100 mL via INTRAVENOUS

## 2021-07-19 LAB — FUNGUS CULTURE WITH STAIN

## 2021-07-19 LAB — FUNGAL ORGANISM REFLEX

## 2021-07-19 LAB — FUNGUS CULTURE RESULT

## 2021-07-20 ENCOUNTER — Ambulatory Visit (INDEPENDENT_AMBULATORY_CARE_PROVIDER_SITE_OTHER): Payer: BC Managed Care – PPO | Admitting: Surgery

## 2021-07-20 ENCOUNTER — Other Ambulatory Visit: Payer: Self-pay

## 2021-07-20 ENCOUNTER — Encounter: Payer: Self-pay | Admitting: Surgery

## 2021-07-20 VITALS — BP 126/78 | HR 71 | Temp 98.2°F | Ht 61.0 in | Wt 179.0 lb

## 2021-07-20 DIAGNOSIS — R933 Abnormal findings on diagnostic imaging of other parts of digestive tract: Secondary | ICD-10-CM | POA: Diagnosis not present

## 2021-07-20 NOTE — Patient Instructions (Addendum)
We will have you do a repeat CT scan in 6 months to check that the area has not changed. We will follow up here after we get the results.   We will send you a letter about this.   Follow-up with our office as needed.  Please call and ask to speak with a nurse if you develop questions or concerns.

## 2021-07-20 NOTE — Progress Notes (Signed)
We reviewed the CT results with the patient.  And correlated with the fact that she has absolutely no symptoms concerning this region. Her abdominal exam is benign and unchanged. We discussed other findings in the CT scan as well.  And the fact that this is all likely benign, nonthreatening and we just want to follow-up to ensure this. Not desiring any explorative surgery without strong indication, we will have her follow-up in 6 months with repeat CT of the abdomen and pelvis. I believe both she and her husband agree to this plan, and I anticipate following through.

## 2021-07-31 LAB — ACID FAST CULTURE WITH REFLEXED SENSITIVITIES (MYCOBACTERIA): Acid Fast Culture: NEGATIVE

## 2021-08-01 ENCOUNTER — Ambulatory Visit
Admission: RE | Admit: 2021-08-01 | Discharge: 2021-08-01 | Disposition: A | Discharge status: Home / Self Care | Payer: BC Managed Care – PPO | Source: Ambulatory Visit | Attending: Pulmonary Disease | Admitting: Pulmonary Disease

## 2021-08-01 ENCOUNTER — Other Ambulatory Visit: Payer: Self-pay

## 2021-08-01 DIAGNOSIS — R911 Solitary pulmonary nodule: Secondary | ICD-10-CM | POA: Diagnosis not present

## 2021-08-01 DIAGNOSIS — J852 Abscess of lung without pneumonia: Secondary | ICD-10-CM | POA: Diagnosis not present

## 2021-08-02 ENCOUNTER — Other Ambulatory Visit: Payer: Self-pay | Admitting: Pulmonary Disease

## 2021-08-02 DIAGNOSIS — J852 Abscess of lung without pneumonia: Secondary | ICD-10-CM

## 2021-08-07 ENCOUNTER — Ambulatory Visit (INDEPENDENT_AMBULATORY_CARE_PROVIDER_SITE_OTHER): Payer: BC Managed Care – PPO | Admitting: Pulmonary Disease

## 2021-08-07 ENCOUNTER — Encounter: Payer: Self-pay | Admitting: Pulmonary Disease

## 2021-08-07 ENCOUNTER — Other Ambulatory Visit: Payer: Self-pay

## 2021-08-07 VITALS — BP 136/82 | HR 89 | Temp 97.3°F | Ht 61.0 in | Wt 180.8 lb

## 2021-08-07 DIAGNOSIS — R052 Subacute cough: Secondary | ICD-10-CM

## 2021-08-07 DIAGNOSIS — J852 Abscess of lung without pneumonia: Secondary | ICD-10-CM

## 2021-08-07 NOTE — Progress Notes (Signed)
Subjective:    Patient ID: Denise Stephens, female    DOB: 1970-12-28, 51 y.o.   MRN: [id number]  Patient Care Team: Malva Limes, MD as PCP - General (Family Medicine) Drema Dallas, DO as Consulting Physician (Neurology)  Chief Complaint  Patient presents with   Follow-up   HPI Denise Stephens is a 51 year old lifelong never smoker who presents for follow-up on the issue of a mass on the right middle lobe.  She was initially evaluated on 29 May 2021, she underwent robotic bronchoscopy on 14 June 2021.  For the details findings please see the operative report.  The patient had distal obstruction noted likely due to a food particle.  Fluid was released after this was removed from the airway. It was deemed that the lesion in the lung was an abscess.  She was treated with Augmentin for 14 days.  She continues to be free of cough or sputum production.  No shortness of breath.  She has been doing very well since the bronchoscopy and feels that this has "cured her".  She does not endorse any gastroesophageal reflux symptoms.  She had a chest CT 10 February was that the previously seen lesion is now measuring 18 x 15 mm previously 27 x 20 mm.    She is not having any fevers, chills or sweats.  Overall she feels well and looks well.   Review of Systems A 10 point review of systems was performed and it is as noted above otherwise negative.  Patient Active Problem List   Diagnosis Date Noted   Abnormal CT scan, gastrointestinal tract 06/22/2021   Bronchitis 05/08/2021   COVID-19 05/25/2020   IBS (irritable bowel syndrome) 05/04/2015   Excess, menstruation 05/04/2015   Vitamin D deficiency 05/04/2015   Social History   Tobacco Use   Smoking status: Never   Smokeless tobacco: Never  Substance Use Topics   Alcohol use: No    Alcohol/week: 0.0 standard drinks   Allergies  Allergen Reactions   Sulfa Antibiotics Other (See Comments)    Unsure of reaction type   Current  Meds  Medication Sig   albuterol (VENTOLIN HFA) 108 (90 Base) MCG/ACT inhaler INHALE 1-2 PUFFS INTO THE LUNGS EVERY 6 (SIX) HOURS AS NEEDED FOR SHORTNESS OF BREATH.   Ferrous Sulfate (IRON) 28 MG TABS Take 28 mg by mouth daily with lunch.   naproxen (NAPROSYN) 500 MG tablet Take 1 tablet (500 mg total) by mouth 2 (two) times daily with a meal. As needed for pain (Patient taking differently: Take 500 mg by mouth 2 (two) times daily as needed (severe menstrual pain.).)   rizatriptan (MAXALT) 10 MG tablet Take 1 tablet earliest onset of migraine.  May repeat in 2 hours if needed.  Maximum 2 tablets in 24 hours.   topiramate (TOPAMAX) 50 MG tablet Take 1 tablet (50 mg total) by mouth at bedtime.   Immunization History  Administered Date(s) Administered   Influenza,inj,Quad PF,6+ Mos 03/04/2013   Tdap 03/04/2013       Objective:   Physical Exam BP 136/82 (BP Location: Left Arm, Patient Position: Sitting, Cuff Size: Normal)    Pulse 89    Temp (!) 97.3 F (36.3 C) (Oral)    Ht 5\' 1"  (1.549 m)    Wt 180 lb 12.8 oz (82 kg)    SpO2 98%    BMI 34.16 kg/m  GENERAL: Obese woman, no acute distress, fully ambulatory, no conversational dyspnea. HEAD: Normocephalic, atraumatic.  EYES: Pupils  equal, round, reactive to light.  No scleral icterus.  MOUTH: Nose/mouth/throat not examined due to masking requirements for COVID 19. NECK: Supple. No thyromegaly. Trachea midline. No JVD.  No adenopathy. PULMONARY: Good air entry bilaterally.  No adventitious sounds. CARDIOVASCULAR: S1 and S2. Regular rate and rhythm.  No rubs, murmurs or gallops heard. ABDOMEN: Obese, otherwise benign. MUSCULOSKELETAL: No joint deformity, no clubbing, no edema.  NEUROLOGIC: No focal deficit, no gait disturbance, speech is fluent. SKIN: Intact,warm,dry. PSYCH: Mood and behavior normal   RADIOGRAPHIC DATA: Representative image from 07 June 2021 chest CT showing right middle lobe pulmonary lesion 27 x 20  mm:   Representative image from 01 August 2021 CT scan showing the right middle lobe lesion is now 18 x 15 mm:      Assessment & Plan:     ICD-10-CM   1. Abscess of middle lobe of right lung without pneumonia (HCC)  J85.2 CT CHEST WO CONTRAST   Improved by latest CT We will rescan in 3 months time Follow-up in 3 months    2. Subacute cough  R05.2    RESOLVED     Orders Placed This Encounter  Procedures   CT CHEST WO CONTRAST    Standing Status:   Future    Standing Expiration Date:   08/07/2022    Scheduling Instructions:     3 months    Order Specific Question:   Is patient pregnant?    Answer:   No    Order Specific Question:   Preferred imaging location?    Answer:   [facility]   Patient is resolving the previously noted abscess.  She is status post successful robotic bronchoscopy drainage of the abscess.  We will see her in follow-up in 3 months time call sooner should any new difficulties arise.    Gailen Shelter, MD Advanced Bronchoscopy PCCM [place] [facility]    *This note was dictated using voice recognition software/Dragon.  Despite best efforts to proofread, errors can occur which can change the meaning. Any transcriptional errors that result from this process are unintentional and may not be fully corrected at the time of dictation.

## 2021-08-07 NOTE — Patient Instructions (Addendum)
We will do a follow-up CT scan in 3 months time.   Most recent scan was reviewed with him.  This shows that the small abscess in your lung has reduced in size significantly.  We will see you in follow-up after the next CT scan of the chest in 3 months time.

## 2021-09-20 ENCOUNTER — Other Ambulatory Visit: Payer: Self-pay | Admitting: Neurology

## 2021-10-30 ENCOUNTER — Ambulatory Visit
Admission: RE | Admit: 2021-10-30 | Discharge: 2021-10-30 | Disposition: A | Discharge status: Home / Self Care | Payer: BC Managed Care – PPO | Source: Ambulatory Visit | Attending: Pulmonary Disease | Admitting: Pulmonary Disease

## 2021-10-30 ENCOUNTER — Other Ambulatory Visit: Payer: Self-pay

## 2021-10-30 DIAGNOSIS — J852 Abscess of lung without pneumonia: Secondary | ICD-10-CM | POA: Insufficient documentation

## 2021-10-30 DIAGNOSIS — R911 Solitary pulmonary nodule: Secondary | ICD-10-CM | POA: Diagnosis not present

## 2021-11-01 ENCOUNTER — Other Ambulatory Visit: Payer: Self-pay

## 2021-11-01 DIAGNOSIS — J852 Abscess of lung without pneumonia: Secondary | ICD-10-CM

## 2021-11-08 ENCOUNTER — Encounter: Payer: Self-pay | Admitting: Pulmonary Disease

## 2021-11-08 ENCOUNTER — Ambulatory Visit (INDEPENDENT_AMBULATORY_CARE_PROVIDER_SITE_OTHER): Payer: BC Managed Care – PPO | Admitting: Pulmonary Disease

## 2021-11-08 VITALS — BP 124/72 | HR 76 | Temp 97.7°F | Ht 61.0 in | Wt 183.6 lb

## 2021-11-08 DIAGNOSIS — R052 Subacute cough: Secondary | ICD-10-CM | POA: Diagnosis not present

## 2021-11-08 DIAGNOSIS — J852 Abscess of lung without pneumonia: Secondary | ICD-10-CM

## 2021-11-08 NOTE — Patient Instructions (Signed)
The area of the abscess has now significantly reduced.  Is forming a small scar.  We will see her in follow-up in 6 months time we will obtain a CT of the chest prior to that visit.

## 2021-11-08 NOTE — Progress Notes (Signed)
Subjective:    Patient ID: Denise Stephens, female    DOB: 1970/10/13, 51 y.o.   MRN: [id number] Patient Care Team: Malva Limes, MD as PCP - General (Family Medicine) Drema Dallas, DO as Consulting Physician (Neurology)  Chief Complaint  Patient presents with   Follow-up    Recent CT-no current sx.     HPI Denise Stephens is a 22 year old lifelong never smoker who presents for follow-up on the issue of a mass on the right middle lobe.  She was initially evaluated on 29 May 2021, she underwent robotic bronchoscopy on 14 June 2021.  For the details findings please see the operative report.  The patient had distal obstruction noted likely due to a food particle.  Fluid was released after this was removed from the airway. It was deemed that the lesion in the lung was an abscess.  She was treated with Augmentin for 14 days.  She continues to be free of cough or sputum production.  No shortness of breath.  Continued to do well since the bronchoscopy and notes that she has not had any cough since the bronchoscopy was performed.  She does not endorse any gastroesophageal reflux symptoms, no dysphagia.   Recall that the measurements of the lesion in question on initial CT chest in December 2022 was 27 x 20 mm.  She had CT chest performed on 30 Oct 2021 showing further reduction of this lesion now down to 16 x 14 mm.  She is not having any fevers, chills or sweats.  Overall she feels well and looks well.  No other symptoms or concerns voiced.  I reviewed the films with the patient today.   Review of Systems A 10 point review of systems was performed and it is as noted above otherwise negative.  Patient Active Problem List   Diagnosis Date Noted   Abnormal CT scan, gastrointestinal tract 06/22/2021   Bronchitis 05/08/2021   COVID-19 05/25/2020   IBS (irritable bowel syndrome) 05/04/2015   Excess, menstruation 05/04/2015   Vitamin D deficiency 05/04/2015   Social History    Tobacco Use   Smoking status: Never   Smokeless tobacco: Never  Substance Use Topics   Alcohol use: No    Alcohol/week: 0.0 standard drinks   Allergies  Allergen Reactions   Sulfa Antibiotics Other (See Comments)    Unsure of reaction type   Current Meds  Medication Sig   albuterol (VENTOLIN HFA) 108 (90 Base) MCG/ACT inhaler INHALE 1-2 PUFFS INTO THE LUNGS EVERY 6 (SIX) HOURS AS NEEDED FOR SHORTNESS OF BREATH.   Ferrous Sulfate (IRON) 28 MG TABS Take 28 mg by mouth daily with lunch.   naproxen (NAPROSYN) 500 MG tablet Take 1 tablet (500 mg total) by mouth 2 (two) times daily with a meal. As needed for pain (Patient taking differently: Take 500 mg by mouth 2 (two) times daily as needed (severe menstrual pain.).)   rizatriptan (MAXALT) 10 MG tablet Take 1 tablet earliest onset of migraine.  May repeat in 2 hours if needed.  Maximum 2 tablets in 24 hours.   topiramate (TOPAMAX) 50 MG tablet TAKE 1 TABLET BY MOUTH EVERYDAY AT BEDTIME   Immunization History  Administered Date(s) Administered   Influenza,inj,Quad PF,6+ Mos 03/04/2013   Tdap 03/04/2013       Objective:   Physical Exam BP 124/72 (BP Location: Left Arm)   Pulse 76   Temp 97.7 F (36.5 C) (Temporal)   Ht 5\' 1"  (1.549 m)  Wt 183 lb 9.6 oz (83.3 kg)   SpO2 100%   BMI 34.69 kg/m  GENERAL: Obese woman, no acute distress, fully ambulatory, no conversational dyspnea. HEAD: Normocephalic, atraumatic.  EYES: Pupils equal, round, reactive to light.  No scleral icterus.  MOUTH: Nose/mouth/throat not examined due to masking requirements for COVID 19. NECK: Supple. No thyromegaly. Trachea midline. No JVD.  No adenopathy. PULMONARY: Good air entry bilaterally.  No adventitious sounds. CARDIOVASCULAR: S1 and S2. Regular rate and rhythm.  No rubs, murmurs or gallops heard. ABDOMEN: Obese, otherwise benign. MUSCULOSKELETAL: No joint deformity, no clubbing, no edema.  NEUROLOGIC: No focal deficit, no gait disturbance,  speech is fluent. SKIN: Intact,warm,dry. PSYCH: Mood and behavior normal  Pre bronchoscopy CT obtained 07 June 2021 showing lung abscess on the right:   CT chest from 30 Oct 2021 showing continued resolution of the right lung abscess:     Assessment & Plan:     ICD-10-CM   1. Abscess of middle lobe of right lung without pneumonia (HCC)  J85.2    Resolving by CT Follow-up CT chest 6 months Follow-up visit after that CT chest    2. Subacute cough - RESOLVED  R05.2    No further issues with cough.     CT has already been ordered.  Patient is to call prior to follow-up should any new difficulties arise.  Gailen Shelter, MD Advanced Bronchoscopy P[facility]    *This note was dictated using voice recognition software/Dragon.  Despite best efforts to proofread, errors can occur which can change the meaning. Any transcriptional errors that result from this process are unintentional and may not be fully corrected at the time of dictation.

## 2021-12-05 ENCOUNTER — Other Ambulatory Visit: Payer: Self-pay

## 2021-12-05 DIAGNOSIS — R933 Abnormal findings on diagnostic imaging of other parts of digestive tract: Secondary | ICD-10-CM

## 2021-12-05 DIAGNOSIS — Q438 Other specified congenital malformations of intestine: Secondary | ICD-10-CM

## 2021-12-16 ENCOUNTER — Other Ambulatory Visit: Payer: Self-pay | Admitting: Neurology

## 2022-01-13 ENCOUNTER — Other Ambulatory Visit: Payer: Self-pay | Admitting: Neurology

## 2022-01-22 ENCOUNTER — Ambulatory Visit: Payer: BC Managed Care – PPO

## 2022-01-29 ENCOUNTER — Other Ambulatory Visit: Payer: Self-pay | Admitting: Neurology

## 2022-01-30 ENCOUNTER — Ambulatory Visit: Payer: BC Managed Care – PPO | Admitting: Surgery

## 2022-02-05 NOTE — Progress Notes (Unsigned)
NEUROLOGY FOLLOW UP OFFICE NOTE  JAYLINE KILBURG [id number]  Assessment/Plan:   Migraine with aura, without status migrainosus, not intractable - doing well   Migraine prevention:  topiramate 50mg  at bedtime Migraine rescue:  rizatriptan 10mg  Limit use of pain relievers to no more than 2 days out of week to prevent risk of rebound or medication-overuse headache. Keep headache diary Follow up one year     Subjective:  KINDEL ROCHEFORT is a 51 year old female who follows up for migraines.   UPDATE: Intensity:  6-8/10 Duration:  Usually within an hour Frequency:  0 to 2 a month Frequency of abortive medication: usually a couple of times a month Current NSAIDS:  naproxen Current analgesics:  none Current triptans:  rizatriptan 10mg  Current ergotamine:  none Current anti-emetic:  none Current muscle relaxants:  none Current Antihypertensive medications:  none Current Antidepressant medications:  none Current Anticonvulsant medications:  topiramate 50mg  at bedtime Current anti-CGRP:  none Current Vitamins/Herbal/Supplements:  none Current Antihistamines/Decongestants:  none Other therapy:  none Hormone/birth control:  none Other medications:  tranexamic acid   Caffeine:  No coffee.  Dr. 52 2 glasses a day Alcohol:  no Smoker:  no Diet:  32 oz water daily.  Does not skip meals Exercise:  no Depression:  no; Anxiety:  no Other pain:  no Sleep hygiene:  Sleep is better.     HISTORY:  Onset:  Infrequent since childhood.  Since about 51 years old, worse over past year. Location:  Back of head and radiate up to forehead bilaterally Quality:  Pressure behind yes, otherwise throbbing Initial intensity:  6-8/10.  She denies new headache, thunderclap headache Aura: Occasional kaleidoscopic vision.  Sometimes sees floaters or gray spots with blurred peripheral vision (occurs with headache) Premonitory Phase:  no Postdrome:  no Associated symptoms:   Photophobia, phonophobia  She denies nausea, vomiting and associated unilateral numbness or weakness. Initial duration:  1 day (longest one lasted 4 days on 03/01/2019.  This one involved only left side of head and had blurred vision in only left eye.  Currently going through pre-menopause) Initial frequency:  8 to 10 a month Initial frequency of abortive medication: BC powder 6 times a month Triggers:  Hormonal (going through pre-menopause, menorrhagia); emotional stress; flavored rice; soy; certain yogurt; certain spices (Mrs. Reino Kent); certain scents (floral perfumes, car smells) Relieving factors:  BC powder Activity:  aggravates   Past NSAIDS:  Advil Migraine, ibuprofen, naproxen Past analgesics:  Excedrin Migraine, Tylenol, BC powder Past abortive triptans:  none Past abortive ergotamine:  none Past muscle relaxants:  none Past anti-emetic:  none Past antihypertensive medications:  none Past antidepressant medications:  none Past anticonvulsant medications:  none Past anti-CGRP:  none Other past therapies:  none  PAST MEDICAL HISTORY: Past Medical History:  Diagnosis Date   Bronchitis    Headache    Lobar pneumonia (HCC) 05/04/2015    MEDICATIONS: Current Outpatient Medications on File Prior to Visit  Medication Sig Dispense Refill   albuterol (VENTOLIN HFA) 108 (90 Base) MCG/ACT inhaler INHALE 1-2 PUFFS INTO THE LUNGS EVERY 6 (SIX) HOURS AS NEEDED FOR SHORTNESS OF BREATH. 6.7 each 0   Ferrous Sulfate (IRON) 28 MG TABS Take 28 mg by mouth daily with lunch.     naproxen (NAPROSYN) 500 MG tablet Take 1 tablet (500 mg total) by mouth 2 (two) times daily with a meal. As needed for pain (Patient taking differently: Take 500 mg by mouth 2 (two) times daily  as needed (severe menstrual pain.).) 60 tablet 4   rizatriptan (MAXALT) 10 MG tablet Take 1 tablet earliest onset of migraine.  May repeat in 2 hours if needed.  Maximum 2 tablets in 24 hours. 10 tablet 5   topiramate (TOPAMAX) 50 MG  tablet TAKE 1 TABLET BY MOUTH EVERYDAY AT BEDTIME 30 tablet 0   No current facility-administered medications on file prior to visit.    ALLERGIES: Allergies  Allergen Reactions   Sulfa Antibiotics Other (See Comments)    Unsure of reaction type    FAMILY HISTORY: Family History  Problem Relation Age of Onset   Heart disease Father    Bone cancer Father 93   Lung cancer Father 31   COPD Father    Thyroid disease Sister    Colon cancer Paternal Grandfather 8      Objective:  Blood pressure 127/80, pulse 98, height 5\' 1"  (1.549 m), weight 177 lb 9.6 oz (80.6 kg), SpO2 99 %. General: No acute distress.  Patient appears well-groomed.   Head:  Normocephalic/atraumatic Eyes:  Fundi examined but not visualized Neck: supple, no paraspinal tenderness, full range of motion Heart:  Regular rate and rhythm Lungs:  Clear to auscultation bilaterally Back: No paraspinal tenderness Neurological Exam: alert and oriented to person, place, and time.  Speech fluent and not dysarthric, language intact.  CN II-XII intact. Bulk and tone normal, muscle strength 5/5 throughout.  Sensation to light touch intact.  Deep tendon reflexes 2+ throughout, toes downgoing.  Finger to nose testing intact.  Gait normal, Romberg negative.   , DO  CC: Shon Millet, MD

## 2022-02-06 ENCOUNTER — Ambulatory Visit (INDEPENDENT_AMBULATORY_CARE_PROVIDER_SITE_OTHER): Payer: BC Managed Care – PPO | Admitting: Neurology

## 2022-02-06 ENCOUNTER — Encounter: Payer: Self-pay | Admitting: Neurology

## 2022-02-06 VITALS — BP 127/80 | HR 98 | Ht 61.0 in | Wt 177.6 lb

## 2022-02-06 DIAGNOSIS — G43109 Migraine with aura, not intractable, without status migrainosus: Secondary | ICD-10-CM | POA: Diagnosis not present

## 2022-02-06 MED ORDER — RIZATRIPTAN BENZOATE 10 MG PO TABS: ORAL_TABLET | ORAL | 11 refills | Status: DC

## 2022-02-06 MED ORDER — TOPIRAMATE 50 MG PO TABS: ORAL_TABLET | ORAL | 3 refills | Status: DC

## 2022-03-05 ENCOUNTER — Ambulatory Visit
Admission: RE | Admit: 2022-03-05 | Discharge: 2022-03-05 | Disposition: A | Discharge status: Home / Self Care | Payer: BC Managed Care – PPO | Source: Ambulatory Visit | Attending: Surgery | Admitting: Surgery

## 2022-03-05 DIAGNOSIS — Q438 Other specified congenital malformations of intestine: Secondary | ICD-10-CM | POA: Diagnosis not present

## 2022-03-05 DIAGNOSIS — K7689 Other specified diseases of liver: Secondary | ICD-10-CM | POA: Diagnosis not present

## 2022-03-05 DIAGNOSIS — R933 Abnormal findings on diagnostic imaging of other parts of digestive tract: Secondary | ICD-10-CM

## 2022-03-05 DIAGNOSIS — Q458 Other specified congenital malformations of digestive system: Secondary | ICD-10-CM

## 2022-03-05 DIAGNOSIS — N281 Cyst of kidney, acquired: Secondary | ICD-10-CM | POA: Diagnosis not present

## 2022-03-05 MED ORDER — IOHEXOL 300 MG/ML  SOLN
100.0000 mL | Freq: Once | INTRAMUSCULAR | Status: AC | PRN
  Administered 2022-03-05: 100 mL via INTRAVENOUS

## 2022-03-08 ENCOUNTER — Ambulatory Visit (INDEPENDENT_AMBULATORY_CARE_PROVIDER_SITE_OTHER): Payer: BC Managed Care – PPO | Admitting: Surgery

## 2022-03-08 ENCOUNTER — Encounter: Payer: Self-pay | Admitting: Surgery

## 2022-03-08 VITALS — BP 129/81 | HR 71 | Temp 99.1°F | Wt 180.6 lb

## 2022-03-08 DIAGNOSIS — R933 Abnormal findings on diagnostic imaging of other parts of digestive tract: Secondary | ICD-10-CM

## 2022-03-08 NOTE — Patient Instructions (Signed)
If you have any concerns or questions, please feel free to call our office. Follow up as needed.  

## 2022-03-08 NOTE — Progress Notes (Signed)
We reviewed the repeat/follow-up CT results with the patient.  And correlated with the fact that she has absolutely no symptoms concerning this region. Her abdominal exam is benign and unchanged. We discussed other findings in the CT scan as well.  And the fact that this is all benign, nonthreatening and we have now confirmed at this. Not desiring any explorative surgery without strong indication, we will have her follow-up as needed. I believe she understands and will be readily able to follow-up as needed.

## 2022-04-20 IMAGING — DX DG CHEST 1V PORT
1 series · 1 of 1 positions shown · non-contrast
Comparison: Chest radiograph 05/08/2021, chest CT 06/07/2021

CLINICAL DATA: Postop right sided bronchoscopy

EXAM:
PORTABLE CHEST 1 VIEW

[chest ap]
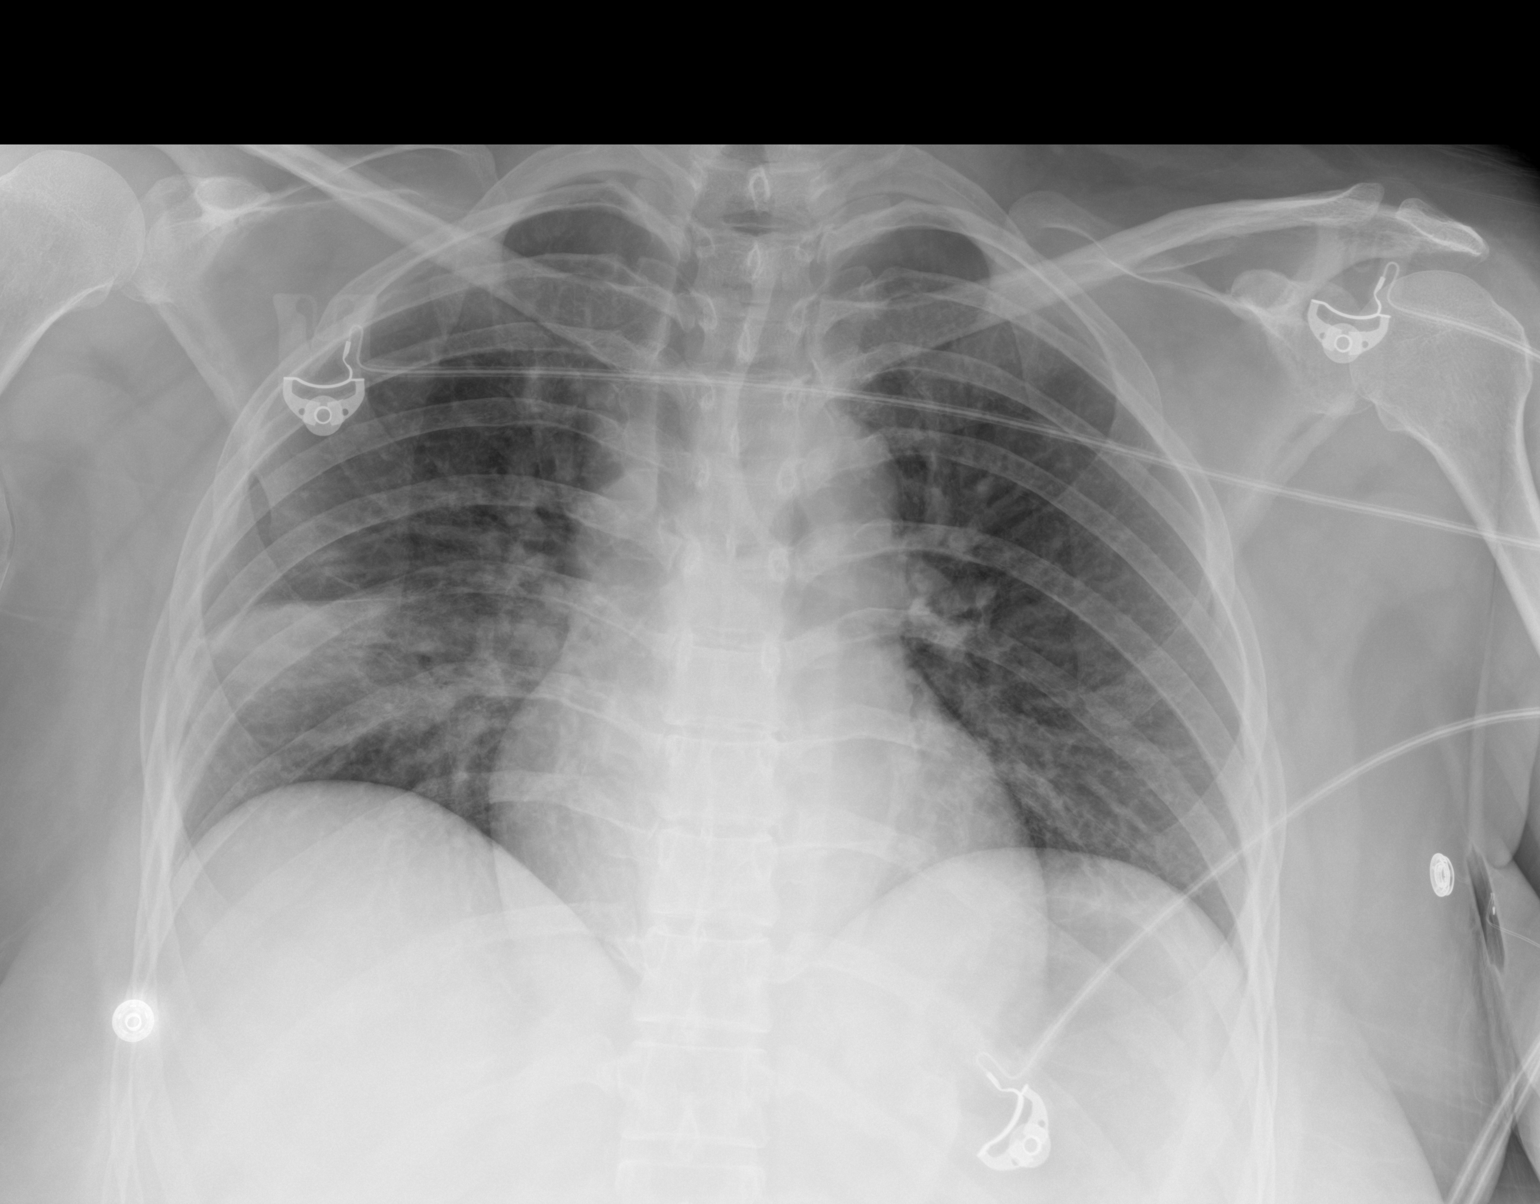

[1 of 1 positions shown; findings below may reference images not displayed]

FINDINGS: The cardiomediastinal silhouette is stable.

There is increased confluent opacity projecting over the right
midlung likely reflecting hemorrhage following biopsy of the
previously seen right middle lobe lesion. There is no pneumothorax.
The left lung is clear. There is no pleural effusion.

There is no acute osseous abnormality.
IMPRESSION: Confluent opacity projecting over the right middle lobe likely
reflects hemorrhage following biopsy of the previously seen right
middle lobe lesion. No pneumothorax.

## 2022-05-18 ENCOUNTER — Ambulatory Visit
Admission: RE | Admit: 2022-05-18 | Discharge: 2022-05-18 | Disposition: A | Discharge status: Home / Self Care | Payer: BC Managed Care – PPO | Source: Ambulatory Visit | Attending: Pulmonary Disease | Admitting: Pulmonary Disease

## 2022-05-18 DIAGNOSIS — K449 Diaphragmatic hernia without obstruction or gangrene: Secondary | ICD-10-CM | POA: Diagnosis not present

## 2022-05-18 DIAGNOSIS — J984 Other disorders of lung: Secondary | ICD-10-CM | POA: Diagnosis not present

## 2022-05-18 DIAGNOSIS — R918 Other nonspecific abnormal finding of lung field: Secondary | ICD-10-CM | POA: Diagnosis not present

## 2022-05-18 DIAGNOSIS — J852 Abscess of lung without pneumonia: Secondary | ICD-10-CM | POA: Diagnosis not present

## 2022-05-21 ENCOUNTER — Other Ambulatory Visit: Payer: Self-pay

## 2022-05-21 DIAGNOSIS — J852 Abscess of lung without pneumonia: Secondary | ICD-10-CM

## 2022-09-05 IMAGING — CT CT CHEST W/O CM
2 of 4 series · 15 of 36 positions shown, 18 images · non-contrast
Comparison: CT 08/01/2021

CLINICAL DATA: Lung abscess.  Follow-up lung nodule



[Series 2: chest 2.00 · axial · 0.64mm/px · z∈[-1134,-882]mm · 12 of 150 slices shown, 15 images]
[im 12/150  mediastinal]
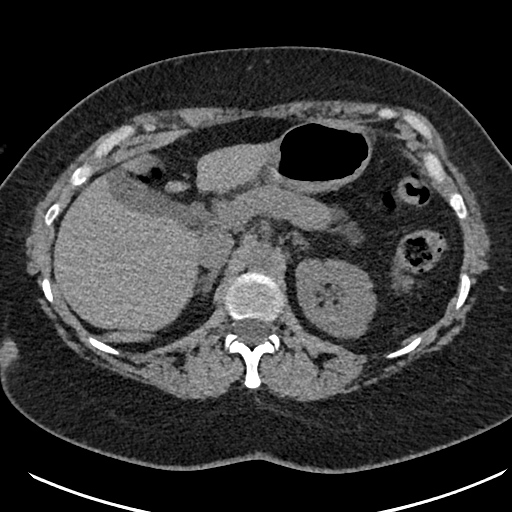
[im 12/150  lung]
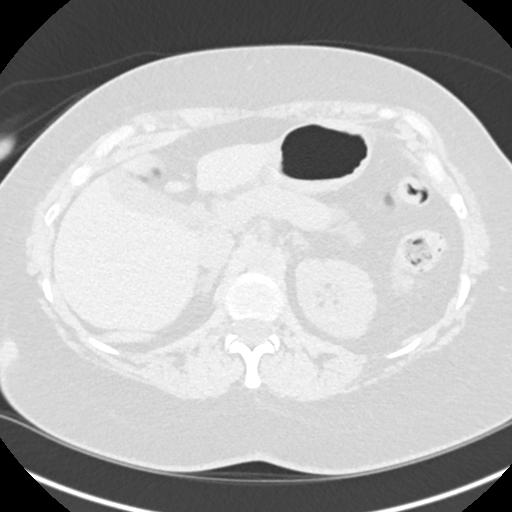
[im 23/150  lung]
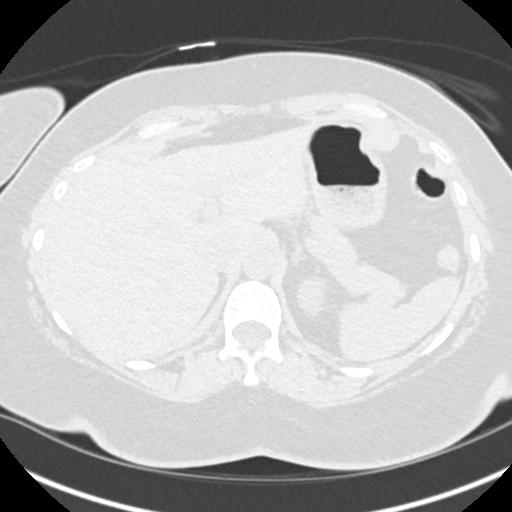
[im 35/150  lung]
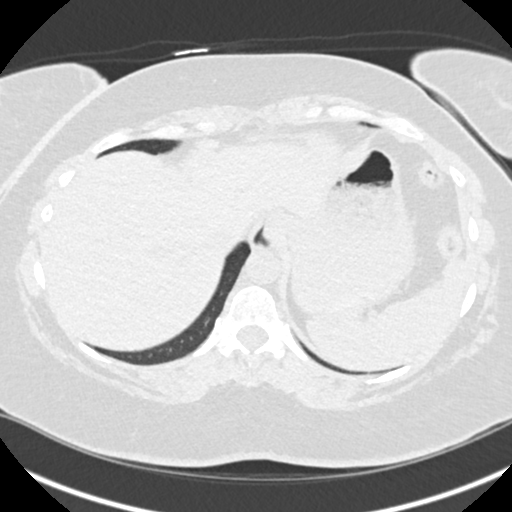
[im 46/150  lung]
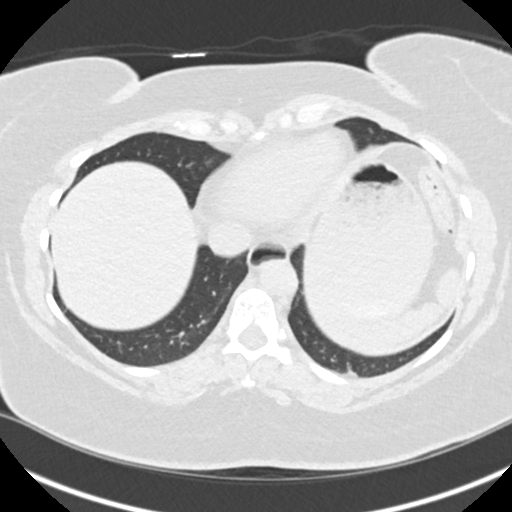
[im 58/150  mediastinal]
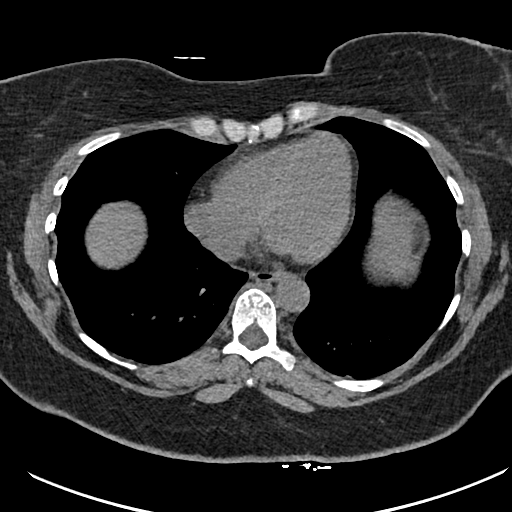
[im 58/150  lung]
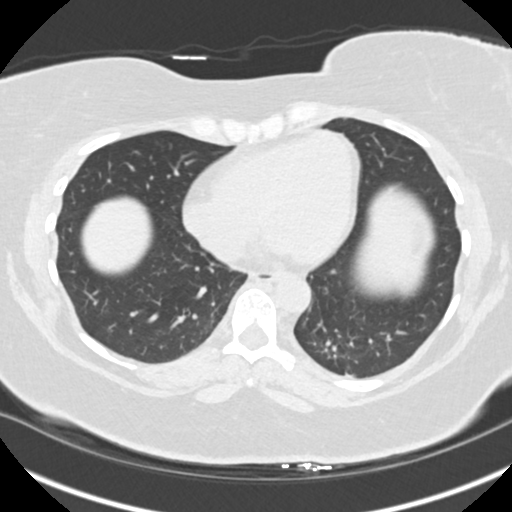
[im 69/150  lung]
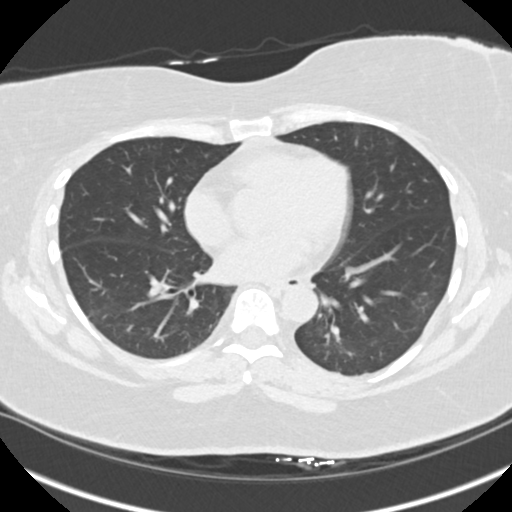
[im 81/150  lung]
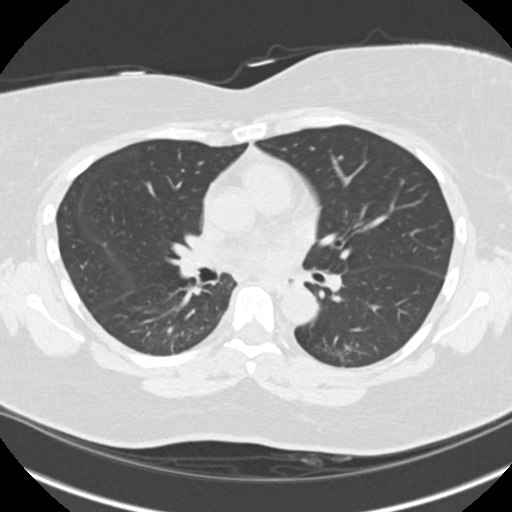
[im 92/150  lung]
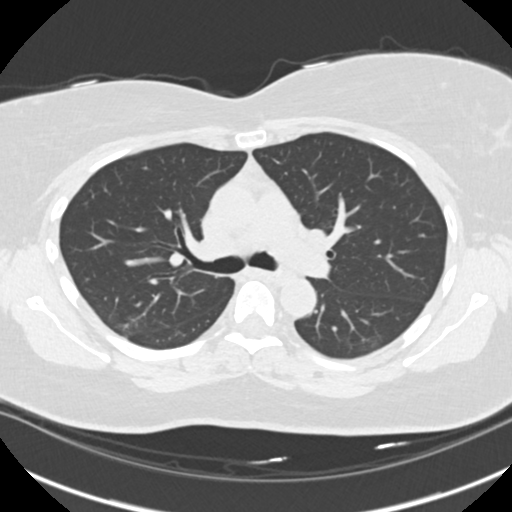
[im 104/150  mediastinal]
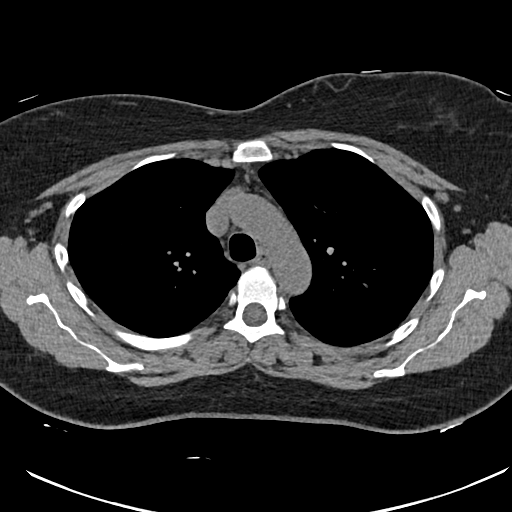
[im 104/150  lung]
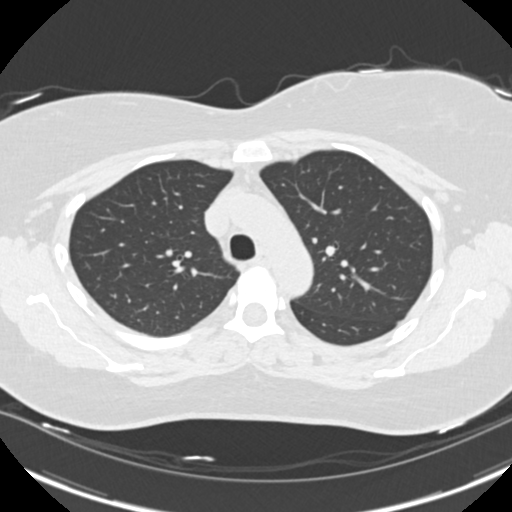
[im 115/150  lung]
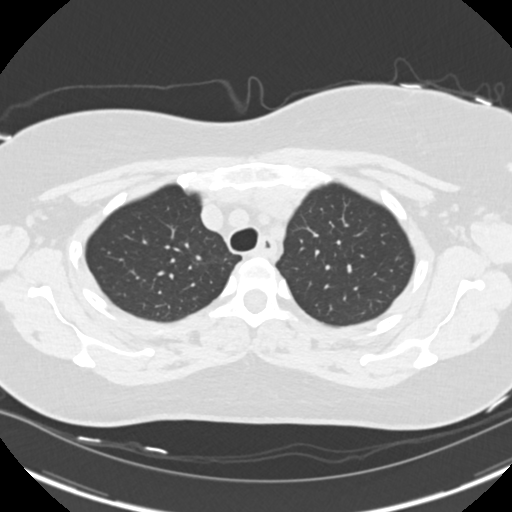
[im 127/150  lung]
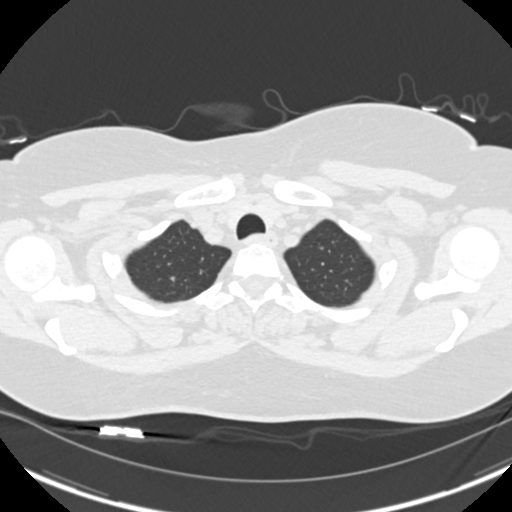
[im 138/150  lung]
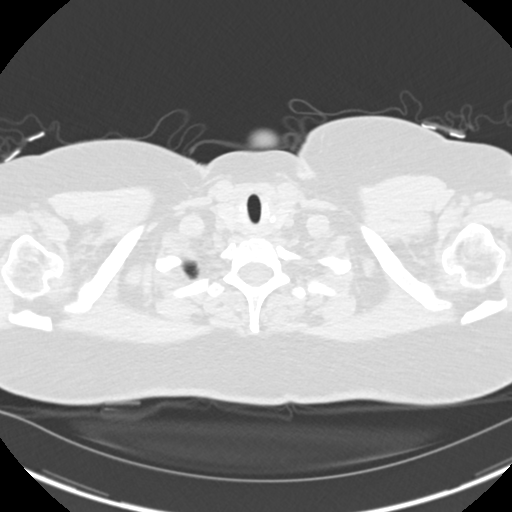

[Series 5: coronals chest 2.00 cor · coronal · 0.59mm/px · 3 of 115 slices shown]
[im 23/115  lung]
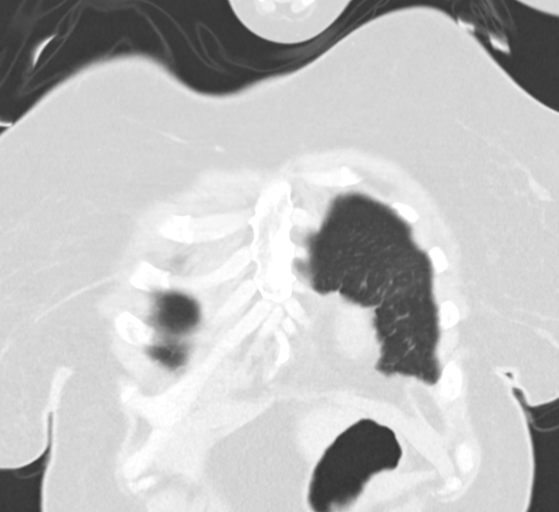
[im 46/115  lung]
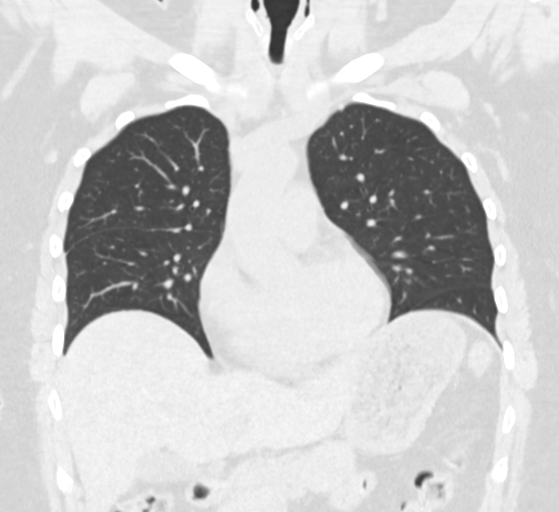
[im 69/115  lung]
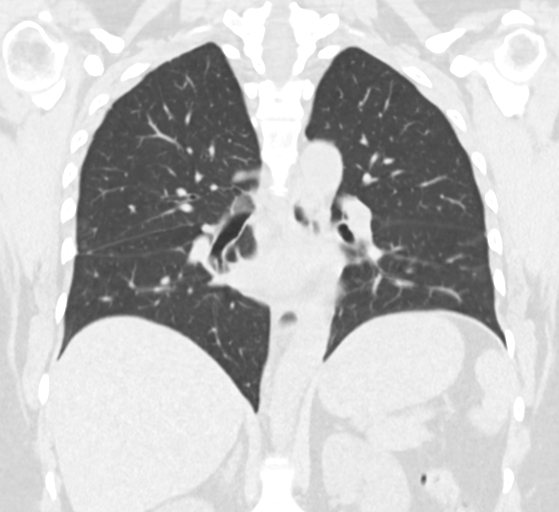

[15 of 36 positions shown; findings below may reference images not displayed]

FINDINGS: Cardiovascular: No significant vascular findings. Normal heart size.
No pericardial effusion.

Mediastinum/Nodes: No axillary or supraclavicular adenopathy. No
mediastinal or hilar adenopathy. No pericardial fluid. Esophagus
normal.

Lungs/Pleura: Flattened nodular density in the RIGHT middle lobe
along the fissure measures 1.6 x 1.4 cm compared to 1.8 x 1.5 cm on
most recent CT. No new pulmonary nodules.

Upper Abdomen: Limited view of the liver, kidneys, pancreas are
unremarkable. Normal adrenal glands. Benign cysts within liver again
demonstrated.

Musculoskeletal: No aggressive osseous lesion.
IMPRESSION: 1. Continued reduction in volume of RIGHT middle lobe pulmonary
nodule.
2. No new pulmonary nodules.  No adenopathy

## 2023-02-11 NOTE — Progress Notes (Unsigned)
NEUROLOGY FOLLOW UP OFFICE NOTE  Denise Stephens [id number]  Assessment/Plan:   Migraine with aura, without status migrainosus, not intractable   Migraine prevention:  topiramate 50mg  at bedtime.  If headaches increase in frequency, would increase to 75mg  at bedtime Migraine rescue:  rizatriptan 10mg   Limit use of pain relievers to no more than 2 days out of week to prevent risk of rebound or medication-overuse headache. Keep headache diary Follow up one year or as needed.     Subjective:  Denise Stephens is a 52 year old female who follows up for migraines.   UPDATE: Doing well.  Slight increase in migraine frequency this summer. Intensity:  6-8/10  Duration:  Usually within an hour with rizatriptan Frequency:  2 to 3 a month  Frequency of abortive medication: usually a couple of times a month Current NSAIDS:  naproxen Current analgesics:  none Current triptans:  rizatriptan 10mg  Current ergotamine:  none Current anti-emetic:  none Current muscle relaxants:  none Current Antihypertensive medications:  none Current Antidepressant medications:  none Current Anticonvulsant medications:  topiramate 50mg  at bedtime Current anti-CGRP:  none Current Vitamins/Herbal/Supplements:  none Current Antihistamines/Decongestants:  none Other therapy:  none Hormone/birth control:  none Other medications:  tranexamic acid   Caffeine:  No coffee.  Dr. Reino Kent 2 glasses a day Alcohol:  no Smoker:  no Diet:  32 oz water daily.  Does not skip meals Exercise:  no Depression:  no; Anxiety:  no Other pain:  no Sleep hygiene:  Sleep is better.     HISTORY:  Onset:  Infrequent since childhood.  Since about 52 years old, worse over past year. Location:  Back of head and radiate up to forehead bilaterally Quality:  Pressure behind yes, otherwise throbbing Initial intensity:  6-8/10.  She denies new headache, thunderclap headache Aura: Occasional kaleidoscopic vision.   Sometimes sees floaters or gray spots with blurred peripheral vision (occurs with headache) Premonitory Phase:  no Postdrome:  no Associated symptoms:  Photophobia, phonophobia  She denies nausea, vomiting and associated unilateral numbness or weakness. Initial duration:  1 day (longest one lasted 4 days on 03/01/2019.  This one involved only left side of head and had blurred vision in only left eye.  Currently going through pre-menopause) Initial frequency:  8 to 10 a month Initial frequency of abortive medication: BC powder 6 times a month Triggers:  Hormonal (going through pre-menopause, menorrhagia); emotional stress; flavored rice; soy; certain yogurt; certain spices (Mrs. Sharilyn Sites); certain scents (floral perfumes, car smells), bright lights, loud sounds Relieving factors:  BC powder Activity:  aggravates   Past NSAIDS:  Advil Migraine, ibuprofen, naproxen Past analgesics:  Excedrin Migraine, Tylenol, BC powder Past abortive triptans:  none Past abortive ergotamine:  none Past muscle relaxants:  none Past anti-emetic:  none Past antihypertensive medications:  none Past antidepressant medications:  none Past anticonvulsant medications:  none Past anti-CGRP:  none Other past therapies:  none  PAST MEDICAL HISTORY: Past Medical History:  Diagnosis Date   Bronchitis    Headache    Lobar pneumonia (HCC) 05/04/2015    MEDICATIONS: Current Outpatient Medications on File Prior to Visit  Medication Sig Dispense Refill   albuterol (VENTOLIN HFA) 108 (90 Base) MCG/ACT inhaler INHALE 1-2 PUFFS INTO THE LUNGS EVERY 6 (SIX) HOURS AS NEEDED FOR SHORTNESS OF BREATH. 6.7 each 0   Ferrous Sulfate (IRON) 28 MG TABS Take 28 mg by mouth daily with lunch.     naproxen (NAPROSYN) 500 MG tablet  Take 1 tablet (500 mg total) by mouth 2 (two) times daily with a meal. As needed for pain (Patient taking differently: Take 500 mg by mouth 2 (two) times daily as needed (severe menstrual pain.).) 60 tablet 4    rizatriptan (MAXALT) 10 MG tablet Take 1 tablet earliest onset of migraine.  May repeat in 2 hours if needed.  Maximum 2 tablets in 24 hours. 10 tablet 11   topiramate (TOPAMAX) 50 MG tablet TAKE 1 TABLET BY MOUTH EVERYDAY AT BEDTIME 90 tablet 3   No current facility-administered medications on file prior to visit.    ALLERGIES: Allergies  Allergen Reactions   Sulfa Antibiotics Other (See Comments)    Unsure of reaction type    FAMILY HISTORY: Family History  Problem Relation Age of Onset   Heart disease Father    Bone cancer Father 15   Lung cancer Father 15   COPD Father    Thyroid disease Sister    Colon cancer Paternal Grandfather 35      Objective:  Blood pressure 125/66, pulse 74, height 5\' 1"  (1.549 m), weight 190 lb (86.2 kg), SpO2 98%. General: No acute distress.  Patient appears well-groomed.   Head:  Normocephalic/atraumatic Eyes:  Fundi examined but not visualized Neck: supple, no paraspinal tenderness, full range of motion Heart:  Regular rate and rhythm Neurological Exam: alert and oriented.  Speech fluent and not dysarthric, language intact.  CN II-XII intact. Bulk and tone normal, muscle strength 5/5 throughout.  Sensation to light touch intact.  Deep tendon reflexes 2+ throughout.  Finger to nose testing intact.  Gait normal, Romberg negative.   Shon Millet, DO  CC: Mila Merry, MD

## 2023-02-12 ENCOUNTER — Encounter: Payer: Self-pay | Admitting: Neurology

## 2023-02-12 ENCOUNTER — Ambulatory Visit: Payer: BC Managed Care – PPO | Admitting: Neurology

## 2023-02-12 VITALS — BP 125/66 | HR 74 | Ht 61.0 in | Wt 190.0 lb

## 2023-02-12 DIAGNOSIS — G43109 Migraine with aura, not intractable, without status migrainosus: Secondary | ICD-10-CM | POA: Diagnosis not present

## 2023-02-12 MED ORDER — RIZATRIPTAN BENZOATE 10 MG PO TABS: ORAL_TABLET | ORAL | 11 refills | Status: DC

## 2023-02-12 MED ORDER — TOPIRAMATE 50 MG PO TABS: ORAL_TABLET | ORAL | 3 refills | Status: DC

## 2023-02-12 NOTE — Patient Instructions (Signed)
Continue topiramate 50mg  at bedtime.  If headache frequency increases, we can increase dose Rizatriptan as needed.  Limit use of pain relievers to no more than 2 days out of week to prevent risk of rebound or medication-overuse headache. Keep headache diary

## 2023-05-14 ENCOUNTER — Ambulatory Visit
Admission: RE | Admit: 2023-05-14 | Discharge: 2023-05-14 | Disposition: A | Discharge status: Home / Self Care | Payer: BC Managed Care – PPO | Source: Ambulatory Visit | Attending: Family Medicine | Admitting: Family Medicine

## 2023-05-14 DIAGNOSIS — J852 Abscess of lung without pneumonia: Secondary | ICD-10-CM | POA: Insufficient documentation

## 2023-05-21 ENCOUNTER — Ambulatory Visit: Payer: BC Managed Care – PPO | Admitting: Pulmonary Disease

## 2023-06-20 ENCOUNTER — Encounter: Payer: Self-pay | Admitting: Nurse Practitioner

## 2023-06-20 ENCOUNTER — Ambulatory Visit: Payer: BC Managed Care – PPO | Admitting: Nurse Practitioner

## 2023-06-20 ENCOUNTER — Ambulatory Visit
Admission: RE | Admit: 2023-06-20 | Discharge: 2023-06-20 | Disposition: A | Discharge status: Home / Self Care | Payer: BC Managed Care – PPO | Source: Ambulatory Visit | Attending: Nurse Practitioner | Admitting: Nurse Practitioner

## 2023-06-20 VITALS — BP 122/80 | HR 83 | Temp 97.6°F | Ht 61.0 in | Wt 183.8 lb

## 2023-06-20 DIAGNOSIS — R0602 Shortness of breath: Secondary | ICD-10-CM | POA: Diagnosis not present

## 2023-06-20 DIAGNOSIS — R051 Acute cough: Secondary | ICD-10-CM

## 2023-06-20 DIAGNOSIS — R058 Other specified cough: Secondary | ICD-10-CM | POA: Diagnosis not present

## 2023-06-20 DIAGNOSIS — R059 Cough, unspecified: Secondary | ICD-10-CM

## 2023-06-20 LAB — NITRIC OXIDE: Nitric Oxide: 12

## 2023-06-20 MED ORDER — PROMETHAZINE-DM 6.25-15 MG/5ML PO SYRP: 5.0000 mL | ORAL_SOLUTION | Freq: Four times a day (QID) | ORAL | 0 refills | Status: DC | PRN

## 2023-06-20 MED ORDER — BENZONATATE 200 MG PO CAPS: 200.0000 mg | ORAL_CAPSULE | Freq: Three times a day (TID) | ORAL | 1 refills | Status: DC | PRN

## 2023-06-20 MED ORDER — PREDNISONE 20 MG PO TABS: 20.0000 mg | ORAL_TABLET | Freq: Every day | ORAL | 0 refills | Status: AC

## 2023-06-20 MED ORDER — ALBUTEROL SULFATE HFA 108 (90 BASE) MCG/ACT IN AERS: 1.0000 | INHALATION_SPRAY | Freq: Four times a day (QID) | RESPIRATORY_TRACT | 0 refills | Status: DC | PRN

## 2023-06-20 NOTE — Patient Instructions (Addendum)
 Continue Albuterol  inhaler 2 puffs every 6 hours as needed for shortness of breath or wheezing. Notify if symptoms persist despite rescue inhaler/neb use.   -Flonase nasal spray 2 sprays each nostril daily until symptoms improve  -Prednisone  20 mg daily daily for 5 days. Take in AM with food  -Guaifenesin (mucinex) [phone] mg Twice daily for congestion -Promethazine  DM cough syrup 5 mL every 6 hours as needed for coughing. May cause drowsiness. Do not drive after taking. Do not take with other over the counter cough syrups  -Benzonatate  1 capsule Three times a day for cough.   Chest x ray today   Upper airway cough syndrome: Suppress your cough to allow your larynx (voice box) to heal.  Limit talking for the next few days. Avoid throat clearing. Work on cough suppression with the above recommended suppressants.  Use sugar free hard candies or non-menthol cough drops during this time to soothe your throat.  Warm tea with honey and lemon.   Follow up with Dr. Tamea as scheduled. If symptoms do not improve or worsen, please contact office for sooner follow up or seek emergency care.

## 2023-06-20 NOTE — Progress Notes (Signed)
Agree with the details of the visit as noted by Katherine Cobb, NP. ° °C. Laura Yasin Ducat, MD °Advanced Bronchoscopy °PCCM [facility] ° °

## 2023-06-20 NOTE — Progress Notes (Signed)
 @Patient  ID: Denise Stephens, female    DOB: Oct 05, 1970, 53 y.o.   MRN: [id number]  Chief Complaint  Patient presents with   Cough    Cough with clear/white phlegm x 1 week. Wheezing. No shortness of breath.     Referring provider: Gasper Nancyann BRAVO, MD  HPI: 53 year old female, never smoker followed for history of abscess of RML. She is a patient of Dr. Tamea and last seen in office 11/08/2021. Past medical history significant for IBS, migraines.   TEST/EVENTS:  05/24/2021 CT chest: left lung clear. 2.7x2.2 cm rounded mass in RML 06/14/2021 bronchoscopy: cytology negative for malignancy; cultures negative  05/14/2023 CT chest wo con: mild bandlike scarring laterally in RML, linear scarring in LLL 5 mm in thickness. Minimal cylindrival btx in LLL. Hepatic cysts, unchanged.   11/08/2021: OV with Dr. Tamea.  Initially evaluated on 05/29/2021 for mass in right middle lobe.  Underwent robotic bronchoscopy on 06/14/2021.  She had distal obstruction likely due to a food particle.  Fluid was released after this was removed from the airway.  Seemed that the lesion in the lung was an abscess.  Treated with Augmentin  for 14 days.  Continues to be free of cough or sputum production.  No shortness of breath.  Has continued to do well since bronchoscopy.  No reflux symptoms or dysphagia.  Recent CT with resolving abscess.  Repeat in 6 months.  06/20/2023: Today - acute Discussed the use of AI scribe software for clinical note transcription with the patient, who gave verbal consent to proceed.  History of Present Illness   The patient, with a history of RML lung abscess, presented with a cough that started last week on Christmas night. The cough is primarily non-productive, but she will occasionally get up a small amount of clear to whitish sputum. Initially, the patient had a fever, but it subsided by Friday, 12/27. She does have some occasional shortness of breath with coughing spells. The  patient denied hemoptysis, chest congestion, leg swelling, nasal congestion, headaches.   The patient reported a rattling sound at night and increased cough severity during the night. She reported a dry mouth and a sore throat, which she attributed to the coughing. The patient has been using an albuterol  inhaler and over-the-counter Mucinex DM for symptom management.  The patient also noted some white patches on her tongue, which she had not noticed before. However, she denied any pain or discomfort in the tongue. The patient has not taken steroids recently, with the last course being approximately a year ago.     FeNO 15 ppb  Allergies  Allergen Reactions   Sulfa Antibiotics Other (See Comments)    Unsure of reaction type    Immunization History  Administered Date(s) Administered   Influenza,inj,Quad PF,6+ Mos 03/04/2013   Tdap 03/04/2013    Past Medical History:  Diagnosis Date   Bronchitis    Headache    Lobar pneumonia (HCC) 05/04/2015    Tobacco History: Social History   Tobacco Use  Smoking Status Never  Smokeless Tobacco Never   Counseling given: Not Answered   Outpatient Medications Prior to Visit  Medication Sig Dispense Refill   albuterol  (VENTOLIN  HFA) 108 (90 Base) MCG/ACT inhaler INHALE 1-2 PUFFS INTO THE LUNGS EVERY 6 (SIX) HOURS AS NEEDED FOR SHORTNESS OF BREATH. 6.7 each 0   Ferrous Sulfate (IRON) 28 MG TABS Take 28 mg by mouth daily with lunch.     rizatriptan  (MAXALT ) 10 MG tablet Take  1 tablet earliest onset of migraine.  May repeat in 2 hours if needed.  Maximum 2 tablets in 24 hours. 10 tablet 11   topiramate  (TOPAMAX ) 50 MG tablet TAKE 1 TABLET BY MOUTH EVERYDAY AT BEDTIME 90 tablet 3   naproxen  (NAPROSYN ) 500 MG tablet Take 1 tablet (500 mg total) by mouth 2 (two) times daily with a meal. As needed for pain (Patient not taking: Reported on 06/20/2023) 60 tablet 4   No facility-administered medications prior to visit.     Review of Systems:    Constitutional: No weight loss or gain, night sweats, fevers, chills, fatigue, or lassitude. HEENT: No headaches, difficulty swallowing, tooth/dental problems, or sore throat. No sneezing, itching, ear ache, nasal congestion, or post nasal drip CV:  No chest pain, orthopnea, PND, swelling in lower extremities, anasarca, dizziness, palpitations, syncope Resp:+shortness of breath with coughing spells; acute cough. No excess mucus or change in color of mucus. No wheezing.  No chest wall deformity GI:  No heartburn, indigestion, abdominal pain, nausea, vomiting, diarrhea, change in bowel habits, loss of appetite, bloody stools.  GU: No dysuria, change in color of urine, urgency or frequency.  No flank pain, no hematuria  Skin: No rash, lesions, ulcerations MSK:  No joint pain or swelling.  Neuro: No dizziness or lightheadedness.  Psych: No depression or anxiety. Mood stable.     Physical Exam:  BP 122/80 (BP Location: Right Arm, Patient Position: Sitting, Cuff Size: Normal)   Pulse 83   Temp 97.6 F (36.4 C) (Temporal)   Ht 5' 1 (1.549 m)   Wt 183 lb 12.8 oz (83.4 kg)   SpO2 97%   BMI 34.73 kg/m   GEN: Pleasant, interactive, well-appearing; obese; in no acute distress HEENT:  Normocephalic and atraumatic. EACs patent bilaterally. TM pearly gray with present light reflex bilaterally. PERRLA. Sclera white. Nasal turbinates erythematous, moist and patent bilaterally. No rhinorrhea present. Oropharynx erythematous and moist, without exudate or edema. No lesions, ulcerations, or postnasal drip. Geographic tongue NECK:  Supple w/ fair ROM. No JVD present. Normal carotid impulses w/o bruits. Thyroid symmetrical with no goiter or nodules palpated. No lymphadenopathy.   CV: RRR, no m/r/g, no peripheral edema. Pulses intact, +2 bilaterally. No cyanosis, pallor or clubbing. PULMONARY:  Unlabored, regular breathing. Clear bilaterally A&P w/o wheezes/rales/rhonchi. Reactive cough. No accessory  muscle use.  GI: BS present and normoactive. Soft, non-tender to palpation. No organomegaly or masses detected.  MSK: No erythema, warmth or tenderness. Cap refil <2 sec all extrem. No deformities or joint swelling noted.  Neuro: A/Ox3. No focal deficits noted.   Skin: Warm, no lesions or rashe Psych: Normal affect and behavior. Judgement and thought content appropriate.     Lab Results:  CBC    Component Value Date/Time   WBC 9.1 03/18/2019 1512   RBC 4.90 03/18/2019 1512   HGB 11.8 03/18/2019 1512   HCT 38.6 03/18/2019 1512   PLT 279 03/18/2019 1512   MCV 79 03/18/2019 1512   MCH 24.1 (L) 03/18/2019 1512   MCHC 30.6 (L) 03/18/2019 1512   RDW 14.1 03/18/2019 1512   LYMPHSABS 1.7 07/18/2016 0835   EOSABS 0.3 07/18/2016 0835   BASOSABS 0.0 07/18/2016 0835    BMET    Component Value Date/Time   NA 140 07/18/2016 0835   K 4.3 07/18/2016 0835   CL 100 07/18/2016 0835   CO2 26 07/18/2016 0835   GLUCOSE 90 07/18/2016 0835   BUN 7 07/18/2016 0835   CREATININE 0.77  07/18/2016 0835   CALCIUM 9.0 07/18/2016 0835   GFRNONAA 95 07/18/2016 0835   GFRAA 109 07/18/2016 0835    BNP No results found for: BNP   Imaging:  No results found.  Administration History     None           No data to display          No results found for: NITRICOXIDE      Assessment & Plan:     Post-viral cough  Cough started on Christmas night with initial fever resolving by Friday. Likely post-viral cough. Chest x-ray ordered to rule out superimposed infection. No indication for antibiotics unless chest x-ray shows acute infection or symptoms worsen/persist beyond 10 days. Discussed prednisone  and cough control/supportive care measures, including benefits and side effects. Advised to avoid driving after taking promethazine  DM due to drowsiness.  - Order chest x-ray - Prescribe prednisone  20 mg daily for 5 days - Prescribe Tessalon  Perles every 8 hours PRN cough - Prescribe  promethazine  DM cough syrup 5 mL every 6 hour PRN cough - Recommend Flonase nasal spray, 2 sprays each nostril daily - Recommend guaifenesin (plain Mucinex) OTC - Advise use of sugar-free lozenges or hard candies - Advise frequent sips of water - Instruct to avoid forcing cough - Advise to call if symptoms worsen or new symptoms develop (fever, hemoptysis) - Refill albuterol  inhaler as needed for SOB  Geographic tongue  No evidence of thrush. No acute pain/tenderness/lesions/ulcerations - No treatment necessary   Lung abscess  Resolved on previous CT imaging. Acute symptoms likely postviral. See above.  Follow-up - Keep appointment with Dr. Lenda on January 16th - Call with chest x-ray results.        Advised if symptoms do not improve or worsen, to please contact office for sooner follow up or seek emergency care.   I spent 35 minutes of dedicated to the care of this patient on the date of this encounter to include pre-visit review of records, face-to-face time with the patient discussing conditions above, post visit ordering of testing, clinical documentation with the electronic health record, making appropriate referrals as documented, and communicating necessary findings to members of the patients care team.  Comer LULLA Rouleau, NP 06/20/2023  Pt aware and understands NP's role.

## 2023-07-04 ENCOUNTER — Ambulatory Visit: Payer: BC Managed Care – PPO | Admitting: Pulmonary Disease

## 2023-07-04 ENCOUNTER — Encounter: Payer: Self-pay | Admitting: Pulmonary Disease

## 2023-07-04 VITALS — BP 126/86 | HR 82 | Temp 97.1°F | Ht 61.0 in | Wt 183.4 lb

## 2023-07-04 DIAGNOSIS — J479 Bronchiectasis, uncomplicated: Secondary | ICD-10-CM

## 2023-07-04 DIAGNOSIS — R058 Other specified cough: Secondary | ICD-10-CM

## 2023-07-04 DIAGNOSIS — R052 Subacute cough: Secondary | ICD-10-CM | POA: Diagnosis not present

## 2023-07-04 DIAGNOSIS — R051 Acute cough: Secondary | ICD-10-CM

## 2023-07-04 DIAGNOSIS — J852 Abscess of lung without pneumonia: Secondary | ICD-10-CM | POA: Diagnosis not present

## 2023-07-04 LAB — NITRIC OXIDE: Nitric Oxide: 12

## 2023-07-04 MED ORDER — AZITHROMYCIN 250 MG PO TABS: ORAL_TABLET | ORAL | 0 refills | Status: AC

## 2023-07-04 MED ORDER — AIRSUPRA 90-80 MCG/ACT IN AERO: 2.0000 | INHALATION_SPRAY | Freq: Four times a day (QID) | RESPIRATORY_TRACT | 2 refills | Status: AC | PRN

## 2023-07-04 NOTE — Progress Notes (Signed)
Subjective:    Patient ID: Denise Stephens, female    DOB: 1970/11/13, 53 y.o.   MRN: [id number]  Patient Care Team: Malva Limes, MD as PCP - General (Family Medicine) Drema Dallas, DO as Consulting Physician (Neurology)  Chief Complaint  Patient presents with   Follow-up    Little DOE and Wheezing. Cough with clear/white sputum in the last 2 days.    BACKGROUND/INTERVAL:53 year old female, never smoker followed for history of abscess of RML.  Status post drainage of the abscess by robotic assisted bronchoscopy on 14 June 2021.  Last seen by me on 11/08/2021.  Most recently seen by Rhunette Croft, NP on 20 June 2023 due to postviral cough.  Presents here for follow-up.  HPI Discussed the use of AI scribe software for clinical note transcription with the patient, who gave verbal consent to proceed.  History of Present Illness   The patient, with a history of lung abscess, presented for a follow-up visit after a subacute cough treated with steroids and antitussives on 2 January. She reported an improvement in her breathing since this treatment. However, she noted a recurrence of a 'hacky' cough two days prior to the visit. The patient had been prescribed Tessalon, Prednisone, and Phenergan DM, during the 2 January visit. She also used Mucinex, Flonase, and an Albuterol inhaler as part of her treatment regimen. The combination of these medications led to a significant improvement in her symptoms. The patient reported using the Albuterol inhaler as needed. Despite the overall improvement, the patient noted a return of an irritating, 'hacky' cough two days prior to the visit.   She has a small residual scar from the previous lung abscess, which does not affect lung function. The patient also has a very small area of bronchiectasis, a residual from a prior infection.     Review of Systems A 10 point review of systems was performed and it is as noted above otherwise negative.    Patient Active Problem List   Diagnosis Date Noted   Abnormal CT scan, gastrointestinal tract 06/22/2021   Bronchitis 05/08/2021   COVID-19 05/25/2020   IBS (irritable bowel syndrome) 05/04/2015   Excess, menstruation 05/04/2015   Vitamin D deficiency 05/04/2015    Social History   Tobacco Use   Smoking status: Never   Smokeless tobacco: Never  Substance Use Topics   Alcohol use: No    Alcohol/week: 0.0 standard drinks of alcohol    Allergies  Allergen Reactions   Sulfa Antibiotics Other (See Comments)    Unsure of reaction type    Current Meds  Medication Sig   albuterol (VENTOLIN HFA) 108 (90 Base) MCG/ACT inhaler Inhale 1-2 puffs into the lungs every 6 (six) hours as needed for shortness of breath.   Albuterol-Budesonide (AIRSUPRA) 90-80 MCG/ACT AERO Inhale 2 puffs into the lungs 4 (four) times daily as needed (wheezing,cough,shortness of breath).   azithromycin (ZITHROMAX) 250 MG tablet Take 2 tablets (500 mg) on  Day 1,  followed by 1 tablet (250 mg) once daily on Days 2 through 5.   Ferrous Sulfate (IRON) 28 MG TABS Take 28 mg by mouth daily with lunch.   rizatriptan (MAXALT) 10 MG tablet Take 1 tablet earliest onset of migraine.  May repeat in 2 hours if needed.  Maximum 2 tablets in 24 hours.   topiramate (TOPAMAX) 50 MG tablet TAKE 1 TABLET BY MOUTH EVERYDAY AT BEDTIME    Immunization History  Administered Date(s) Administered   Influenza,inj,Quad  PF,6+ Mos 03/04/2013   Tdap 03/04/2013        Objective:   BP 126/86 (BP Location: Right Arm, Cuff Size: Normal)   Pulse 82   Temp (!) 97.1 F (36.2 C)   Ht 5\' 1"  (1.549 m)   Wt 183 lb 6.4 oz (83.2 kg)   SpO2 98%   BMI 34.65 kg/m   SpO2: 98 % O2 Device: None (Room air)  GENERAL: Obese woman, no acute distress, fully ambulatory, no conversational dyspnea. HEAD: Normocephalic, atraumatic.  EYES: Pupils equal, round, reactive to light.  No scleral icterus.  MOUTH: Nose/mouth/throat not examined due to  masking requirements for COVID 19. NECK: Supple. No thyromegaly. Trachea midline. No JVD.  No adenopathy. PULMONARY: Good air entry bilaterally.  No adventitious sounds. CARDIOVASCULAR: S1 and S2. Regular rate and rhythm.  No rubs, murmurs or gallops heard. ABDOMEN: Obese, otherwise benign. MUSCULOSKELETAL: No joint deformity, no clubbing, no edema.  NEUROLOGIC: No focal deficit, no gait disturbance, speech is fluent. SKIN: Intact,warm,dry. PSYCH: Mood and behavior normal.  Lab Results  Component Value Date   NITRICOXIDE 12 07/04/2023   Progression of the lung nodule previously treated with was noted to be an abscess on the right lung with only residual scarring now remaining:    Assessment & Plan:     ICD-10-CM   1. Subacute cough  R05.2 Nitric oxide    2. Post-viral cough syndrome  R05.8     3. Bronchiectasis without complication (HCC) - mild  J47.9     4. Abscess of middle lobe of right lung without pneumonia (HCC)  J85.2    Resolved after robotic bronchoscopy intervention 05/2021     Orders Placed This Encounter  Procedures   Nitric oxide   Meds ordered this encounter  Medications   azithromycin (ZITHROMAX) 250 MG tablet    Sig: Take 2 tablets (500 mg) on  Day 1,  followed by 1 tablet (250 mg) once daily on Days 2 through 5.    Dispense:  6 each    Refill:  0   Albuterol-Budesonide (AIRSUPRA) 90-80 MCG/ACT AERO    Sig: Inhale 2 puffs into the lungs 4 (four) times daily as needed (wheezing,cough,shortness of breath).    Dispense:  10.7 g    Refill:  2   Discussion:    Subacute Cough Subacute cough persisting for two days, described as hacky and irritating. Previous treatment in 2 January with prednisone, promethazine DM, Tessalon, Mucinex, Flonase, and albuterol inhaler was effective. Current chest x-ray and CT scan show no pneumonia, only a residual scar from a prior lung abscess. Differential includes post-infectious cough and possible mycoplasma infection.  Informed consent provided regarding azithromycin and Airsupra, including potential side effects and benefits. - Order nitric oxide test to assess airway inflammation: Nitric oxide 12 ppb - Prescribe azithromycin (Z-Pak): 2 tablets on the first day, then 1 tablet daily until finished - Switch albuterol inhaler to Airsupra (albuterol with anti-inflammatory) as needed every 4-6 hours - Provide coupon for Airsupra - Schedule follow-up in 4-6 weeks to reassess cough  Bronchiectasis Bronchiectasis noted in a very small area, residual from prior infection, not related to surgery. - Monitor condition, no immediate intervention required  Lung Abscess (Resolved) Lung abscess successfully treated and resolved. Current imaging shows only a residual scar. - No further scans required unless new symptoms develop  Follow-up - Schedule follow-up appointment in 4-6 weeks.     Advised if symptoms do not improve or worsen, to please contact office  for sooner follow up or seek emergency care.    I spent 35 minutes of dedicated to the care of this patient on the date of this encounter to include pre-visit review of records, face-to-face time with the patient discussing conditions above, post visit ordering of testing, clinical documentation with the electronic health record, making appropriate referrals as documented, and communicating necessary findings to members of the patients care team.     C. Danice Goltz, MD Advanced Bronchoscopy [facility]    *This note was generated using voice recognition software/Dragon and/or AI transcription program.  Despite best efforts to proofread, errors can occur which can change the meaning. Any transcriptional errors that result from this process are unintentional and may not be fully corrected at the time of dictation.

## 2023-07-04 NOTE — Patient Instructions (Addendum)
VISIT SUMMARY:  During your follow-up visit, we discussed your recent recurrence of a 'hacky' cough and reviewed your treatment history. Your previous medications had significantly improved your symptoms, but you experienced a return of the cough two days ago. We also reviewed your lung health, including a small residual scar from a past lung abscess and a very small area of bronchiectasis.  YOUR PLAN:  -SUBACUTE COUGH: A subacute cough is a cough that lasts between 3 to 8 weeks. Your recent cough is likely due to a post-infectious cause or a possible mycoplasma infection. We conducted a nitric oxide test to check for airway inflammation. You have been prescribed azithromycin (Z-Pak) to take as directed and switched to Airsupra inhaler to use every 4-6 hours as needed. A coupon for Paulene Floor has been provided. We will reassess your cough in 4-6 weeks.  -BRONCHIECTASIS: Bronchiectasis is a condition where the bronchial tubes in your lungs are permanently  widened, and thickened. This is a residual effect from a prior infection. Currently, it is very small and does not require any immediate intervention. We will continue to monitor it.  -LUNG ABSCESS (RESOLVED): A lung abscess is a pus-filled cavity in the lung caused by an infection. Your lung abscess has been successfully treated and resolved, leaving only a small scar. No further scans are needed unless you develop new symptoms.  INSTRUCTIONS:  Please schedule a follow-up appointment in 4-6 weeks to reassess your cough and overall lung health.

## 2023-07-07 ENCOUNTER — Other Ambulatory Visit: Payer: Self-pay | Admitting: Nurse Practitioner

## 2023-07-07 DIAGNOSIS — R058 Other specified cough: Secondary | ICD-10-CM

## 2023-07-29 ENCOUNTER — Encounter: Payer: Self-pay | Admitting: Pulmonary Disease

## 2023-07-29 ENCOUNTER — Ambulatory Visit: Payer: BC Managed Care – PPO | Admitting: Pulmonary Disease

## 2023-07-29 VITALS — BP 110/78 | HR 67 | Temp 96.9°F | Ht 61.0 in | Wt 182.2 lb

## 2023-07-29 DIAGNOSIS — R058 Other specified cough: Secondary | ICD-10-CM | POA: Diagnosis not present

## 2023-07-29 DIAGNOSIS — J852 Abscess of lung without pneumonia: Secondary | ICD-10-CM | POA: Diagnosis not present

## 2023-07-29 NOTE — Patient Instructions (Signed)
 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 You reported significant improvement in both areas.  YOUR PLAN:  -POST-INFECTIOUS COUGH: A post-infectious cough is a lingering cough that can last for weeks after an infection. Your cough has significantly improved with the use of an albuterol  and budesonide inhaler, which helps reduce inflammation. You have tapered off the medication but can use the inhaler as needed. Azithromycin  also helped in your recovery. Please report any recurrence or worsening of symptoms, and consider further testing if symptoms persist or worsen.  -Pulmonary Abscess: treated with robotic-assisted bronchoscopy. The process cleared up completely.  INSTRUCTIONS:  Please follow up with the pulmonology clinic as needed for any respiratory symptoms. Contact the pulmonology clinic directly if you have any issues.

## 2023-07-29 NOTE — Progress Notes (Signed)
 Subjective:    Patient ID: HLI RENT, female    DOB: April 12, 1971, 53 y.o.   MRN: [id number]  Patient Care Team: Lamon Pillow, MD as PCP - General (Family Medicine) Merriam Abbey, DO as Consulting Physician (Neurology)  Chief Complaint  Patient presents with   Follow-up    No SOB, wheezing or cough.     BACKGROUND/INTERVAL:  53 year old female, never smoker followed for history of lung abscess of RML.  Status post drainage of the abscess by robotic assisted bronchoscopy on 14 June 2021.  Last seen by me on 04 July 2023 for postviral cough.  Presents here for follow-up.   HPI Discussed the use of AI scribe software for clinical note transcription with the patient, who gave verbal consent to proceed.  History of Present Illness   Denise Stephens "Denise Stephens" is a 53 year old female who presents for follow-up of pulmonary abscess treated by robotic assisted bronchoscopy, and a post infectious cough.  She is following up to of a post-infectious cough, which has been improving.  On her visit here on 16 January we placed her on albuterol  and budesonide inhaler (AirSupra ), which she found to be very effective, noting that it 'calmed everything down' within 30 minutes to an hour. She has since tapered off the medication as her symptoms improved, but she still has the inhaler available for as-needed use. She also mentioned using azithromycin , which she found helpful.  Recall that Denise Stephens previously had had issues with a lung abscess treated with robotic assisted bronchoscopy 14 June 2021.  This healed without sequela.  Overall she feels well and looks well.    Review of Systems A 10 point review of systems was performed and it is as noted above otherwise negative.   Patient Active Problem List   Diagnosis Date Noted   Abnormal CT scan, gastrointestinal tract 06/22/2021   Bronchitis 05/08/2021   COVID-19 05/25/2020   IBS (irritable bowel syndrome)  05/04/2015   Excess, menstruation 05/04/2015   Vitamin D deficiency 05/04/2015    Social History   Tobacco Use   Smoking status: Never   Smokeless tobacco: Never  Substance Use Topics   Alcohol use: No    Alcohol/week: 0.0 standard drinks of alcohol    Allergies  Allergen Reactions   Sulfa Antibiotics Other (See Comments)    Unsure of reaction type    Current Meds  Medication Sig   Albuterol -Budesonide (AIRSUPRA ) 90-80 MCG/ACT AERO Inhale 2 puffs into the lungs 4 (four) times daily as needed (wheezing,cough,shortness of breath).   Ferrous Sulfate (IRON) 28 MG TABS Take 28 mg by mouth daily with lunch.   rizatriptan  (MAXALT ) 10 MG tablet Take 1 tablet earliest onset of migraine.  May repeat in 2 hours if needed.  Maximum 2 tablets in 24 hours.   topiramate  (TOPAMAX ) 50 MG tablet TAKE 1 TABLET BY MOUTH EVERYDAY AT BEDTIME    Immunization History  Administered Date(s) Administered   Influenza,inj,Quad PF,6+ Mos 03/04/2013   Tdap 03/04/2013        Objective:     BP 110/78 (BP Location: Right Arm, Cuff Size: Normal)   Pulse 67   Temp (!) 96.9 F (36.1 C)   Ht 5\' 1"  (1.549 m)   Wt 182 lb 3.2 oz (82.6 kg)   SpO2 99%   BMI 34.43 kg/m   SpO2: 99 % O2 Device: None (Room air)  GENERAL: Obese woman, no acute distress, fully ambulatory, no conversational dyspnea. HEAD:  Normocephalic, atraumatic.  EYES: Pupils equal, round, reactive to light.  No scleral icterus.  MOUTH: Nose/mouth/throat not examined due to masking requirements for COVID 19. NECK: Supple. No thyromegaly. Trachea midline. No JVD.  No adenopathy. PULMONARY: Good air entry bilaterally.  No adventitious sounds. CARDIOVASCULAR: S1 and S2. Regular rate and rhythm.  No rubs, murmurs or gallops heard. ABDOMEN: Obese, otherwise benign. MUSCULOSKELETAL: No joint deformity, no clubbing, no edema.  NEUROLOGIC: No focal deficit, no gait disturbance, speech is fluent. SKIN: Intact,warm,dry. PSYCH: Mood and  behavior normal.    Assessment & Plan:     ICD-10-CM   1. Post-viral cough syndrome  R05.8     2. Abscess of middle lobe of right lung without pneumonia (HCC)  J85.2    RESOLVED     Discussion:    Post-infectious cough Denise Stephens's post-infectious cough has significantly improved. Initially treated with an albuterol  and budesonide inhaler (AirSupra ), which provided rapid relief by reducing inflammation.  Recall postinfectious cough can last 8-12 weeks post-infection. She has tapered off the medication and prefers not to stay on it long-term. The inhaler is to be used as needed for any recurrence of symptoms. Azithromycin  also contributed to her recovery. She prefers to contact the pulmonology clinic directly for any laboratory issues rather than her primary care provider. - Use albuterol  and budesonide (AirSupra ) inhaler as needed for cough - Report any recurrence or worsening of symptoms - Consider further testing if symptoms persist or worsen  Pulmonary abscess Denise Stephens's right middle lobe abscess was treated with robotic-assisted bronchoscopy. No current symptoms or issues related to this condition were discussed during this visit. - No immediate action required - Follow up if symptoms recur  Follow-up - Follow up with pulmonology clinic as needed for respiratory symptoms     Advised if symptoms do not improve or worsen, to please contact office for sooner follow up or seek emergency care.    I spent 21 minutes of dedicated to the care of this patient on the date of this encounter to include pre-visit review of records, face-to-face time with the patient discussing conditions above, post visit ordering of testing, clinical documentation with the electronic health record, making appropriate referrals as documented, and communicating necessary findings to members of the patients care team.     C. Chloe Counter, MD Advanced Bronch[facility]    *This  note was generated using voice recognition software/Dragon and/or AI transcription program.  Despite best efforts to proofread, errors can occur which can change the meaning. Any transcriptional errors that result from this process are unintentional and may not be fully corrected at the time of dictation.

## 2023-09-16 ENCOUNTER — Encounter: Payer: Self-pay | Admitting: Neurology

## 2024-02-03 NOTE — Progress Notes (Unsigned)
 NEUROLOGY FOLLOW UP OFFICE NOTE  TAHLIA DEAMER [id number]  Assessment/Plan:   Migraine with aura, without status migrainosus, not intractable   Migraine prevention:  topiramate  50mg  at bedtime.  *** Migraine rescue:  rizatriptan  10mg  *** Limit use of pain relievers to no more than 9 days out of the month to prevent risk of rebound or medication-overuse headache. Keep headache diary Follow up one year or as needed.     Subjective:  Denise Stephens is a 53 year old female who follows up for migraines.   UPDATE: *** Intensity:  6-8/10  Duration:  Usually within an hour with rizatriptan  Frequency:  2 to 3 a month  Frequency of abortive medication: usually a couple of times a month Current NSAIDS:  naproxen  Current analgesics:  none Current triptans:  rizatriptan  10mg  Current ergotamine:  none Current anti-emetic:  none Current muscle relaxants:  none Current Antihypertensive medications:  none Current Antidepressant medications:  none Current Anticonvulsant medications:  topiramate  50mg  at bedtime Current anti-CGRP:  none Current Vitamins/Herbal/Supplements:  none Current Antihistamines/Decongestants:  none Other therapy:  none Hormone/birth control:  none Other medications:  tranexamic acid    Caffeine:  No coffee.  Dr. Nunzio 2 glasses a day Alcohol:  no Smoker:  no Diet:  32 oz water daily.  Does not skip meals Exercise:  no Depression:  no; Anxiety:  no Other pain:  no Sleep hygiene:  Sleep is better.     HISTORY:  Onset:  Infrequent since childhood.  Since about 53 years old, worse over past year. Location:  Back of head and radiate up to forehead bilaterally Quality:  Pressure behind yes, otherwise throbbing Initial intensity:  6-8/10.  She denies new headache, thunderclap headache Aura: Occasional kaleidoscopic vision.  Sometimes sees floaters or gray spots with blurred peripheral vision (occurs with headache) Premonitory Phase:   no Postdrome:  no Associated symptoms:  Photophobia, phonophobia  She denies nausea, vomiting and associated unilateral numbness or weakness. Initial duration:  1 day (longest one lasted 4 days on 03/01/2019.  This one involved only left side of head and had blurred vision in only left eye.  Currently going through pre-menopause) Initial frequency:  8 to 10 a month Initial frequency of abortive medication: BC powder 6 times a month Triggers:  Hormonal (going through pre-menopause, menorrhagia); emotional stress; flavored rice; soy; certain yogurt; certain spices (Mrs. Hollis); certain scents (floral perfumes, car smells), bright lights, loud sounds Relieving factors:  BC powder Activity:  aggravates   Past NSAIDS:  Advil Migraine, ibuprofen, naproxen  Past analgesics:  Excedrin Migraine, Tylenol, BC powder Past abortive triptans:  none Past abortive ergotamine:  none Past muscle relaxants:  none Past anti-emetic:  none Past antihypertensive medications:  none Past antidepressant medications:  none Past anticonvulsant medications:  none Past anti-CGRP:  none Other past therapies:  none  PAST MEDICAL HISTORY: Past Medical History:  Diagnosis Date   Bronchitis    Headache    Lobar pneumonia (HCC) 05/04/2015    MEDICATIONS: Current Outpatient Medications on File Prior to Visit  Medication Sig Dispense Refill   Albuterol -Budesonide (AIRSUPRA ) 90-80 MCG/ACT AERO Inhale 2 puffs into the lungs 4 (four) times daily as needed (wheezing,cough,shortness of breath). 10.7 g 2   Albuterol -Budesonide (AIRSUPRA ) 90-80 MCG/ACT AERO Inhale 1-2 puffs into the lungs every 4 (four) hours as needed. (Patient not taking: Reported on 07/29/2023) 10.7 g 1   benzonatate  (TESSALON ) 200 MG capsule Take 1 capsule (200 mg total) by mouth 3 (three) times daily  as needed for cough. (Patient not taking: Reported on 07/29/2023) 30 capsule 1   Ferrous Sulfate (IRON) 28 MG TABS Take 28 mg by mouth daily with lunch.      promethazine -dextromethorphan (PROMETHAZINE -DM) 6.25-15 MG/5ML syrup Take 5 mLs by mouth 4 (four) times daily as needed. (Patient not taking: Reported on 07/29/2023) 180 mL 0   rizatriptan  (MAXALT ) 10 MG tablet Take 1 tablet earliest onset of migraine.  May repeat in 2 hours if needed.  Maximum 2 tablets in 24 hours. 10 tablet 11   topiramate  (TOPAMAX ) 50 MG tablet TAKE 1 TABLET BY MOUTH EVERYDAY AT BEDTIME 90 tablet 3   No current facility-administered medications on file prior to visit.    ALLERGIES: Allergies  Allergen Reactions   Sulfa Antibiotics Other (See Comments)    Unsure of reaction type    FAMILY HISTORY: Family History  Problem Relation Age of Onset   Heart disease Father    Bone cancer Father 44   Lung cancer Father 19   COPD Father    Thyroid disease Sister    Colon cancer Paternal Grandfather 88      Objective:  *** General: No acute distress.  Patient appears well-groomed.   Head:  Normocephalic/atraumatic Neck:  Supple.  No paraspinal tenderness.  Full range of motion. Heart:  Regular rate and rhythm. Neuro:  Alert and oriented.  Speech fluent and not dysarthric.  Language intact.  CN II-XII intact.  Bulk and tone normal.  Muscle strength 5/5 throughout.  Sensation to light touch intact.  Deep tendon reflexes 2+ throughout, toes downgoing.  Gait normal.  Romberg negative.    Juliene Dunnings, DO  CC: Nancyann Perry, MD

## 2024-02-05 ENCOUNTER — Telehealth: Payer: Self-pay | Admitting: Neurology

## 2024-02-05 ENCOUNTER — Ambulatory Visit (INDEPENDENT_AMBULATORY_CARE_PROVIDER_SITE_OTHER): Admitting: Neurology

## 2024-02-05 ENCOUNTER — Encounter: Payer: Self-pay | Admitting: Neurology

## 2024-02-05 VITALS — BP 135/69 | HR 81 | Ht 61.0 in | Wt 186.6 lb

## 2024-02-05 DIAGNOSIS — G43109 Migraine with aura, not intractable, without status migrainosus: Secondary | ICD-10-CM | POA: Diagnosis not present

## 2024-02-05 MED ORDER — TOPIRAMATE 25 MG PO TABS: 75.0000 mg | ORAL_TABLET | Freq: Every day | ORAL | 5 refills | Status: DC

## 2024-02-05 MED ORDER — RIZATRIPTAN BENZOATE 10 MG PO TABS: ORAL_TABLET | ORAL | 11 refills | Status: AC

## 2024-02-05 NOTE — Telephone Encounter (Signed)
 Pt was here this morning and said that Dr Skeet was going to give her a food chart for the migraines.she would like to have that mailed to her  please

## 2024-02-05 NOTE — Telephone Encounter (Signed)
 Migraine Diet mailed.

## 2024-02-05 NOTE — Patient Instructions (Signed)
 Increase topiramate  to 75mg  at bedtime Instead of rizatriptan , take Ubrelvy at earliest onset of migraine.  May repeat in 2 hours.  Maximum 2 tablets in 24 hours.  Let me know if effective and doesn't cause drowsiness Limit use of pain relievers to no more than 9 days out of the month to prevent risk of rebound or medication-overuse headache. Keep headache diary Follow up 1 year

## 2024-02-12 ENCOUNTER — Ambulatory Visit: Payer: BC Managed Care – PPO | Admitting: Neurology

## 2024-03-02 ENCOUNTER — Other Ambulatory Visit: Payer: Self-pay | Admitting: Neurology

## 2025-02-09 ENCOUNTER — Ambulatory Visit: Admitting: Neurology
